# Patient Record
Sex: Female | Born: 1975 | Race: White | Hispanic: No | Marital: Single | Smoking: Current every day smoker
Health system: Southern US, Community
[De-identification: ages and names within clinical notes are randomized; demographics above are authoritative.]

## PROBLEM LIST (undated history)

## (undated) DIAGNOSIS — R000 Tachycardia, unspecified: Secondary | ICD-10-CM

---

## 2004-08-27 ENCOUNTER — Emergency Department: Payer: Self-pay | Admitting: Emergency Medicine

## 2005-03-20 ENCOUNTER — Emergency Department: Payer: Self-pay | Admitting: Emergency Medicine

## 2005-03-21 ENCOUNTER — Emergency Department (HOSPITAL_COMMUNITY): Admission: EM | Admit: 2005-03-21 | Discharge: 2005-03-21 | Payer: Self-pay | Admitting: Emergency Medicine

## 2006-01-14 ENCOUNTER — Emergency Department (HOSPITAL_COMMUNITY): Admission: EM | Admit: 2006-01-14 | Discharge: 2006-01-14 | Payer: Self-pay | Admitting: Emergency Medicine

## 2006-03-02 ENCOUNTER — Emergency Department (HOSPITAL_COMMUNITY): Admission: EM | Admit: 2006-03-02 | Discharge: 2006-03-02 | Payer: Self-pay | Admitting: Emergency Medicine

## 2006-03-17 ENCOUNTER — Emergency Department (HOSPITAL_COMMUNITY): Admission: EM | Admit: 2006-03-17 | Discharge: 2006-03-17 | Payer: Self-pay | Admitting: Emergency Medicine

## 2006-08-13 ENCOUNTER — Emergency Department: Payer: Self-pay | Admitting: Emergency Medicine

## 2006-09-07 ENCOUNTER — Emergency Department: Payer: Self-pay | Admitting: Emergency Medicine

## 2006-09-24 ENCOUNTER — Emergency Department (HOSPITAL_COMMUNITY): Admission: EM | Admit: 2006-09-24 | Discharge: 2006-09-24 | Payer: Self-pay | Admitting: Emergency Medicine

## 2006-12-23 ENCOUNTER — Emergency Department (HOSPITAL_COMMUNITY): Admission: EM | Admit: 2006-12-23 | Discharge: 2006-12-23 | Payer: Self-pay | Admitting: Emergency Medicine

## 2007-04-14 ENCOUNTER — Emergency Department: Payer: Self-pay | Admitting: Emergency Medicine

## 2007-05-01 ENCOUNTER — Emergency Department: Payer: Self-pay | Admitting: Emergency Medicine

## 2007-06-06 ENCOUNTER — Emergency Department: Payer: Self-pay | Admitting: Emergency Medicine

## 2007-07-06 ENCOUNTER — Emergency Department (HOSPITAL_COMMUNITY): Admission: EM | Admit: 2007-07-06 | Discharge: 2007-07-06 | Payer: Self-pay | Admitting: Emergency Medicine

## 2007-07-06 ENCOUNTER — Emergency Department: Payer: Self-pay | Admitting: Emergency Medicine

## 2007-07-12 ENCOUNTER — Emergency Department: Payer: Self-pay | Admitting: Emergency Medicine

## 2007-10-15 ENCOUNTER — Observation Stay: Payer: Self-pay | Admitting: Obstetrics and Gynecology

## 2007-10-28 ENCOUNTER — Observation Stay: Payer: Self-pay

## 2007-10-30 ENCOUNTER — Ambulatory Visit: Payer: Self-pay | Admitting: Unknown Physician Specialty

## 2007-12-24 ENCOUNTER — Observation Stay: Payer: Self-pay

## 2008-01-08 ENCOUNTER — Observation Stay: Payer: Self-pay

## 2008-01-21 ENCOUNTER — Inpatient Hospital Stay: Payer: Self-pay

## 2008-10-31 ENCOUNTER — Ambulatory Visit: Payer: Self-pay | Admitting: Unknown Physician Specialty

## 2009-12-26 ENCOUNTER — Emergency Department: Payer: Self-pay | Admitting: Emergency Medicine

## 2013-10-27 ENCOUNTER — Ambulatory Visit: Payer: Self-pay | Admitting: Family Medicine

## 2014-08-20 ENCOUNTER — Emergency Department: Payer: Self-pay | Admitting: Emergency Medicine

## 2014-08-20 LAB — BASIC METABOLIC PANEL
ANION GAP: 9 (ref 7–16)
BUN: 5 mg/dL — AB (ref 7–18)
CALCIUM: 8.6 mg/dL (ref 8.5–10.1)
CHLORIDE: 108 mmol/L — AB (ref 98–107)
CO2: 24 mmol/L (ref 21–32)
Creatinine: 0.76 mg/dL (ref 0.60–1.30)
EGFR (African American): >60
EGFR (Non-African Amer.): >60
GLUCOSE: 88 mg/dL (ref 65–99)
Osmolality: 278 (ref 275–301)
Potassium: 3.6 mmol/L (ref 3.5–5.1)
Sodium: 141 mmol/L (ref 136–145)

## 2014-08-20 LAB — CBC
HCT: 39.8 % (ref 35.0–47.0)
HGB: 13.1 g/dL (ref 12.0–16.0)
MCH: 28 pg (ref 26.0–34.0)
MCHC: 32.8 g/dL (ref 32.0–36.0)
MCV: 86 fL (ref 80–100)
Platelet: 301 10*3/uL (ref 150–440)
RBC: 4.66 10*6/uL (ref 3.80–5.20)
RDW: 13.5 % (ref 11.5–14.5)
WBC: 8.5 10*3/uL (ref 3.6–11.0)

## 2015-07-21 ENCOUNTER — Encounter: Payer: Self-pay | Admitting: Emergency Medicine

## 2015-07-21 ENCOUNTER — Emergency Department
Admission: EM | Admit: 2015-07-21 | Discharge: 2015-07-21 | Disposition: A | Discharge status: Home / Self Care | Payer: Managed Care, Other (non HMO) | Attending: Emergency Medicine | Admitting: Emergency Medicine

## 2015-07-21 DIAGNOSIS — Z880 Allergy status to penicillin: Secondary | ICD-10-CM | POA: Insufficient documentation

## 2015-07-21 DIAGNOSIS — R0981 Nasal congestion: Secondary | ICD-10-CM | POA: Diagnosis present

## 2015-07-21 DIAGNOSIS — F17210 Nicotine dependence, cigarettes, uncomplicated: Secondary | ICD-10-CM | POA: Diagnosis not present

## 2015-07-21 DIAGNOSIS — J0100 Acute maxillary sinusitis, unspecified: Secondary | ICD-10-CM | POA: Diagnosis not present

## 2015-07-21 MED ORDER — DOXYCYCLINE HYCLATE 50 MG PO CAPS: 100.0000 mg | ORAL_CAPSULE | Freq: Two times a day (BID) | ORAL | Status: DC

## 2015-07-21 NOTE — ED Provider Notes (Signed)
[facility] Emergency Department Provider Note  ____________________________________________  Time seen: On arrival  I have reviewed the triage vital signs and the nursing notes.   HISTORY  Chief Complaint Nasal Congestion    HPI Tamara Bridges is a 39 y.o. female who presents with complaints of sinus congestion and pain for one day. She also complains of mild hoarseness of her voice. She feels she has a sinus infection which is running down her throat and causing her to cough. She denies fevers chills. She has no other complaints    History reviewed. No pertinent past medical history.  There are no active problems to display for this patient.   History reviewed. No pertinent past surgical history.  Current Outpatient Rx  Name  Route  Sig  Dispense  Refill  . doxycycline (VIBRAMYCIN) 50 MG capsule   Oral   Take 2 capsules (100 mg total) by mouth 2 (two) times daily.   28 capsule   0     Allergies Azithromycin; Penicillins; and Prednisone  History reviewed. No pertinent family history.  Social History Social History  Substance Use Topics  . Smoking status: Current Every Day Smoker -- 1.00 packs/day    Types: Cigarettes  . Smokeless tobacco: None  . Alcohol Use: No    Review of Systems  Constitutional: Negative for fever. Eyes: Negative for visual changes. ENT: Negative for sore throat   Genitourinary: Negative for dysuria. Musculoskeletal: Negative for back pain. Skin: Negative for rash. Neurological: Negative for headaches or focal weakness   ____________________________________________   PHYSICAL EXAM:  VITAL SIGNS: ED Triage Vitals  Enc Vitals Group     BP 07/21/15 2226 125/60 mmHg     Pulse Rate 07/21/15 2226 81     Resp 07/21/15 2226 18     Temp 07/21/15 2226 97.9 F (36.6 C)     Temp Source 07/21/15 2226 Oral     SpO2 07/21/15 2226 98 %     Weight 07/21/15 2226 250 lb (113.399 kg)     Height 07/21/15 2226   (1.676 m)     Head Cir --      Peak Flow --      Pain Score 07/21/15 2227 6     Pain Loc --      Pain Edu? --      Excl. in GC? --      Constitutional: Alert and oriented. Well appearing and in no distress. Eyes: Conjunctivae are normal.  ENT   Head: Normocephalic and atraumatic. Mild tenderness to palpation over the maxillary sinuses bilaterally   Mouth/Throat: Mucous membranes are moist. Pharynx is normal Cardiovascular: Normal rate, regular rhythm.  Respiratory: Normal respiratory effort without tachypnea nor retractions.  Gastrointestinal: Soft and non-tender in all quadrants. No distention. There is no CVA tenderness. Musculoskeletal: Nontender with normal range of motion in all extremities.  Skin:  Skin is warm, dry and intact. No rash noted. Psychiatric: Mood and affect are normal. Patient exhibits appropriate insight and judgment.  ____________________________________________    LABS (pertinent positives/negatives)  Labs Reviewed - No data to display  ____________________________________________     ____________________________________________    RADIOLOGY I have personally reviewed any xrays that were ordered on this patient: None  ____________________________________________   PROCEDURES  Procedure(s) performed: none   ____________________________________________   INITIAL IMPRESSION / ASSESSMENT AND PLAN / ED COURSE  Pertinent labs & imaging results that were available during my care of the patient were reviewed by me and  considered in my medical decision making (see chart for details).  History of present illness consistent with sinus infection, given her allergies we will discharge her with doxycycline Rx which is worked for her in the past. Return precautions discussed  ____________________________________________   FINAL CLINICAL IMPRESSION(S) / ED DIAGNOSES  Final diagnoses:  Acute maxillary sinusitis, recurrence not  specified     Jene Everyobert Ivry Pigue, MD 07/21/15 [id number]

## 2015-07-21 NOTE — ED Notes (Signed)
Patient with no complaints at this time. Respirations even and unlabored. Skin warm/dry. Discharge instructions reviewed with patient at this time. Patient given opportunity to voice concerns/ask questions. Patient discharged at this time and left Emergency Department with steady gait.   

## 2015-07-21 NOTE — Discharge Instructions (Signed)

## 2015-07-21 NOTE — ED Notes (Signed)
Pt reports nasal congestion and head congestion.  Hearing acuity is unaffected.  She presents with voice hoarseness in what she feels is laryngitis and she seeks treatment for relief of symptoms.  She reports no dizziness, difficulty breathing, or general weakness.

## 2015-11-27 ENCOUNTER — Ambulatory Visit (INDEPENDENT_AMBULATORY_CARE_PROVIDER_SITE_OTHER): Payer: Managed Care, Other (non HMO)

## 2015-11-27 ENCOUNTER — Ambulatory Visit (INDEPENDENT_AMBULATORY_CARE_PROVIDER_SITE_OTHER): Payer: Managed Care, Other (non HMO) | Admitting: Podiatry

## 2015-11-27 DIAGNOSIS — M722 Plantar fascial fibromatosis: Secondary | ICD-10-CM | POA: Diagnosis not present

## 2015-11-27 DIAGNOSIS — Q828 Other specified congenital malformations of skin: Secondary | ICD-10-CM

## 2015-11-27 MED ORDER — DICLOFENAC SODIUM 75 MG PO TBEC: 75.0000 mg | DELAYED_RELEASE_TABLET | Freq: Two times a day (BID) | ORAL | Status: DC

## 2015-11-27 NOTE — Progress Notes (Signed)
   Subjective:    Patient ID: Tamara DickinsonKristina E Embry, female    DOB: 10/28/1975, 40 y.o.   MRN: [id number]  HPI: She presents today with a chief complaint of bilateral heel pain left greater than right after several years of having no pain. She's also complaining of a painful lesion sub-fifth metatarsal head of the left foot which she thinks contains a foreign body. She continues to take diclofenac on a regular basis and she wears [employer] shoes.    Review of Systems  HENT: Positive for sinus pressure.   Musculoskeletal: Positive for back pain.  All other systems reviewed and are negative.      Objective:   Physical Exam:: Vital signs are stable alert and oriented 3. Pulses are palpable. Neurologic sensorium is intact. Muscle strength is intact. Deep tendon reflexes are intact. Orthopedic evaluation of a straight awl just distal to the ankle for range of motion without crepitation that she does have moderate to severe pain on palpation medial calcaneal tubercle bilateral. Cutaneous evaluation does demonstrate a Salter porokeratotic lesion plantar aspect sub-fifth metatarsal head of the left foot. Radiographs taken today do not demonstrate any type of osseus abnormalities other than soft tissue increase in density at the plantar fascial calcaneal insertion site and no foreign body noted in the left foot.      Assessment & Plan:  Assessment: Plantar fasciitis bilateral. Left greater than right. Or keratoma fifth metatarsal base left foot.  Plan: I injected the fifth metatarsal area today under the lesion to alleviate the symptoms. I was better able to enucleate the lesion today. I also injected the bilateral heels and placed her plantar fascial braces bilaterally. I will follow-up with her in 1 month. She will continue her diclofenac. We did discuss appropriate shoe gear particularly wider shoes.

## 2015-11-27 NOTE — Patient Instructions (Signed)

## 2015-12-27 ENCOUNTER — Ambulatory Visit: Payer: Managed Care, Other (non HMO) | Admitting: Podiatry

## 2016-07-26 ENCOUNTER — Other Ambulatory Visit: Payer: Self-pay | Admitting: Podiatry

## 2016-07-26 NOTE — Telephone Encounter (Signed)
Pt needs an appt prior to future refills. 

## 2016-09-17 ENCOUNTER — Emergency Department: Payer: Managed Care, Other (non HMO)

## 2016-09-17 ENCOUNTER — Emergency Department
Admission: EM | Admit: 2016-09-17 | Discharge: 2016-09-17 | Disposition: A | Discharge status: Home / Self Care | Payer: Managed Care, Other (non HMO) | Attending: Emergency Medicine | Admitting: Emergency Medicine

## 2016-09-17 ENCOUNTER — Encounter: Payer: Self-pay | Admitting: Emergency Medicine

## 2016-09-17 DIAGNOSIS — F17210 Nicotine dependence, cigarettes, uncomplicated: Secondary | ICD-10-CM | POA: Diagnosis not present

## 2016-09-17 DIAGNOSIS — Z79899 Other long term (current) drug therapy: Secondary | ICD-10-CM | POA: Diagnosis not present

## 2016-09-17 DIAGNOSIS — R109 Unspecified abdominal pain: Secondary | ICD-10-CM | POA: Diagnosis present

## 2016-09-17 DIAGNOSIS — N390 Urinary tract infection, site not specified: Secondary | ICD-10-CM | POA: Insufficient documentation

## 2016-09-17 LAB — URINALYSIS, COMPLETE (UACMP) WITH MICROSCOPIC
Bacteria, UA: NONE SEEN
Bilirubin Urine: NEGATIVE
Glucose, UA: NEGATIVE mg/dL
Ketones, ur: NEGATIVE mg/dL
Nitrite: NEGATIVE
PH: 5 (ref 5.0–8.0)
Protein, ur: 100 mg/dL — AB
SPECIFIC GRAVITY, URINE: 1.019 (ref 1.005–1.030)

## 2016-09-17 LAB — BASIC METABOLIC PANEL
ANION GAP: 10 (ref 5–15)
BUN: 10 mg/dL (ref 6–20)
CHLORIDE: 103 mmol/L (ref 101–111)
CO2: 23 mmol/L (ref 22–32)
Calcium: 8.8 mg/dL — ABNORMAL LOW (ref 8.9–10.3)
Creatinine, Ser: 0.67 mg/dL (ref 0.44–1.00)
GFR calc non Af Amer: >60 mL/min (ref >60)
Glucose, Bld: 137 mg/dL — ABNORMAL HIGH (ref 65–99)
POTASSIUM: 3.9 mmol/L (ref 3.5–5.1)
Sodium: 136 mmol/L (ref 135–145)

## 2016-09-17 LAB — CBC
HCT: 40.3 % (ref 35.0–47.0)
HEMOGLOBIN: 13.9 g/dL (ref 12.0–16.0)
MCH: 27.8 pg (ref 26.0–34.0)
MCHC: 34.4 g/dL (ref 32.0–36.0)
MCV: 80.8 fL (ref 80.0–100.0)
Platelets: 377 10*3/uL (ref 150–440)
RBC: 4.99 MIL/uL (ref 3.80–5.20)
RDW: 14.7 % — ABNORMAL HIGH (ref 11.5–14.5)
WBC: 21.1 10*3/uL — AB (ref 3.6–11.0)

## 2016-09-17 LAB — POCT PREGNANCY, URINE: PREG TEST UR: NEGATIVE

## 2016-09-17 MED ORDER — CEPHALEXIN 500 MG PO CAPS: 500.0000 mg | ORAL_CAPSULE | Freq: Two times a day (BID) | ORAL | 0 refills | Status: DC

## 2016-09-17 MED ORDER — DEXTROSE 5 % IV SOLN: 1.0000 g | Freq: Once | INTRAVENOUS | Status: DC

## 2016-09-17 MED ORDER — CEFTRIAXONE SODIUM-DEXTROSE 1-3.74 GM-% IV SOLR
1.0000 g | Freq: Once | INTRAVENOUS | Status: AC
  Administered 2016-09-17: 1 g via INTRAVENOUS
  Filled 2016-09-17: qty 50

## 2016-09-17 NOTE — ED Triage Notes (Signed)
Patient ambulatory to triage with steady gait, without difficulty or distress noted; pt reports hematuria, dysuria, right flank pain radiating to right lower abd since yesterday

## 2016-09-17 NOTE — ED Notes (Signed)
Patient transported to CT 

## 2016-09-17 NOTE — ED Provider Notes (Signed)
[facility] Emergency Department Provider Note   ____________________________________________    I have reviewed the triage vital signs and the nursing notes.   HISTORY  Chief Complaint Dysuria    HPI Tamara Bridges is a 41 y.o. female who presents with complaints of urinary hesitancy and discomfort which started yesterday evening. She reports a history of chronic back pain and reports her back pain is noted different than usual. She denies fevers or chills. No nausea or vomiting. Otherwise she feels quite well. No vaginal discharge.   No past medical history on file.  There are no active problems to display for this patient.   No past surgical history on file.  Prior to Admission medications   Medication Sig Start Date End Date Taking? Authorizing Provider  Butalbital-APAP-Caffeine 50-300-40 MG CAPS  11/21/15   Historical Provider, MD  cephALEXin (KEFLEX) 500 MG capsule Take 1 capsule (500 mg total) by mouth 2 (two) times daily. 09/17/16   Jene Everyobert Bawi Lakins, MD  cyclobenzaprine (FLEXERIL) 10 MG tablet  11/21/15   Historical Provider, MD  diclofenac (VOLTAREN) 75 MG EC tablet  11/20/15   Historical Provider, MD  diclofenac (VOLTAREN) 75 MG EC tablet TAKE ONE TABLET BY MOUTH TWICE DAILY 07/26/16   Max T Hyatt, DPM  levocetirizine (XYZAL) 5 MG tablet  11/20/15   Historical Provider, MD  phentermine (ADIPEX-P) 37.5 MG tablet  11/20/15   Historical Provider, MD     Allergies Amoxicillin; Azithromycin; Penicillins; and Prednisone  No family history on file.  Social History Social History  Substance Use Topics  . Smoking status: Current Every Day Smoker    Packs/day: 1.00    Types: Cigarettes  . Smokeless tobacco: Not on file  . Alcohol use No    Review of Systems  Constitutional: No fever/chills Eyes: No visual changes.    Respiratory: Denies shortness of breath. Gastrointestinal: As above Genitourinary: As above Musculoskeletal: Chronic  back pain Skin: Negative for rash. Neurological: Negative for headaches   10-point ROS otherwise negative.  ____________________________________________   PHYSICAL EXAM:  VITAL SIGNS: ED Triage Vitals  Enc Vitals Group     BP 09/17/16 0702 122/84     Pulse Rate 09/17/16 0702 87     Resp 09/17/16 0702 16     Temp 09/17/16 0702 97.8 F (36.6 C)     Temp Source 09/17/16 0702 Oral     SpO2 09/17/16 0702 98 %     Weight 09/17/16 0653 220 lb (99.8 kg)     Height 09/17/16 0653 5\' 6"  (1.676 m)     Head Circumference --      Peak Flow --      Pain Score 09/17/16 0653 10     Pain Loc --      Pain Edu? --      Excl. in GC? --     Constitutional: Alert and oriented. No acute distress. Pleasant and interactive   Nose: No congestion/rhinnorhea.  Cardiovascular: Normal rate, regular rhythm.  Good peripheral circulation. Respiratory: Normal respiratory effort.  No retractions.  Gastrointestinal: Soft and nontender. No distention.  No CVA tenderness. Genitourinary: deferred Musculoskeletal: Warm and well perfused Neurologic:  Normal speech and language. No gross focal neurologic deficits are appreciated.  Skin:  Skin is warm, dry and intact. No rash noted. Psychiatric: Mood and affect are normal. Speech and behavior are normal.  ____________________________________________   LABS (all labs ordered are listed, but only abnormal results are displayed)  Labs Reviewed  URINALYSIS, COMPLETE (UACMP) WITH MICROSCOPIC - Abnormal; Notable for the following:       Result Value   Color, Urine YELLOW (*)    APPearance CLOUDY (*)    Hgb urine dipstick LARGE (*)    Protein, ur 100 (*)    Leukocytes, UA LARGE (*)    Squamous Epithelial / LPF 0-5 (*)    All other components within normal limits  BASIC METABOLIC PANEL - Abnormal; Notable for the following:    Glucose, Bld 137 (*)    Calcium 8.8 (*)    All other components within normal limits  CBC - Abnormal; Notable for the following:     WBC 21.1 (*)    RDW 14.7 (*)    All other components within normal limits  URINE CULTURE  POC URINE PREG, ED  POCT PREGNANCY, URINE   ____________________________________________  EKG  None ____________________________________________  RADIOLOGY  CT renal stone study unremarkable ____________________________________________   PROCEDURES  Procedure(s) performed: No    Critical Care performed: No ____________________________________________   INITIAL IMPRESSION / ASSESSMENT AND PLAN / ED COURSE  Pertinent labs & imaging results that were available during my care of the patient were reviewed by me and considered in my medical decision making (see chart for details).  Patient well-appearing and in no acute distress. She is nontoxic, her vital signs are normal. Urinalysis is consistent with UTI, she has no CVA tenderness. She   has a significantly elevated white blood cell count but again is very well-appearing. Renal stone study is negative. Treated with IV Rocephin and will go home on Keflex for UTI. Return precautions discussed ____________________________________________   FINAL CLINICAL IMPRESSION(S) / ED DIAGNOSES  Final diagnoses:  Right flank pain  Lower urinary tract infectious disease      NEW MEDICATIONS STARTED DURING THIS VISIT:  New Prescriptions   CEPHALEXIN (KEFLEX) 500 MG CAPSULE    Take 1 capsule (500 mg total) by mouth 2 (two) times daily.     Note:  This document was prepared using Dragon voice recognition software and may include unintentional dictation errors.    Jene Every, MD 09/17/16 [phone]

## 2016-09-19 LAB — URINE CULTURE: Culture: >=100000 — AB

## 2016-09-20 ENCOUNTER — Telehealth: Payer: Self-pay | Admitting: Pharmacist

## 2016-09-20 NOTE — Telephone Encounter (Signed)
Pt called and informed of urine cx results. Urine growing ESBL- not covered by d/c abx. [facility] [address] Pt concerned about cost of abx. 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 2018 Formulary searched, appears to be on tier 1 generic- should be pt lowest copay.  macrobid 100mg  BID x7 days, # 14 NR called into pharmacy. Left message on pharmacy voicemail. Authorized by Dr. Willy EddyPatrick Robinson  Olene FlossMelissa D Maccia, Pharm.D, BCPS Clinical Pharmacist

## 2019-02-16 ENCOUNTER — Other Ambulatory Visit: Payer: Self-pay | Admitting: Internal Medicine

## 2019-02-21 LAB — NOVEL CORONAVIRUS, NAA: SARS-CoV-2, NAA: NOT DETECTED

## 2019-06-14 DIAGNOSIS — Z20828 Contact with and (suspected) exposure to other viral communicable diseases: Secondary | ICD-10-CM | POA: Diagnosis not present

## 2019-10-28 DIAGNOSIS — Z111 Encounter for screening for respiratory tuberculosis: Secondary | ICD-10-CM | POA: Diagnosis not present

## 2020-05-04 DIAGNOSIS — Z0000 Encounter for general adult medical examination without abnormal findings: Secondary | ICD-10-CM | POA: Diagnosis not present

## 2020-05-04 DIAGNOSIS — R7309 Other abnormal glucose: Secondary | ICD-10-CM | POA: Diagnosis not present

## 2020-05-04 DIAGNOSIS — E669 Obesity, unspecified: Secondary | ICD-10-CM | POA: Diagnosis not present

## 2020-05-04 DIAGNOSIS — E785 Hyperlipidemia, unspecified: Secondary | ICD-10-CM | POA: Diagnosis not present

## 2020-07-13 ENCOUNTER — Other Ambulatory Visit: Payer: Self-pay | Admitting: Family Medicine

## 2020-07-13 DIAGNOSIS — R69 Illness, unspecified: Secondary | ICD-10-CM | POA: Diagnosis not present

## 2020-07-13 DIAGNOSIS — Z124 Encounter for screening for malignant neoplasm of cervix: Secondary | ICD-10-CM | POA: Diagnosis not present

## 2020-07-13 DIAGNOSIS — Z1231 Encounter for screening mammogram for malignant neoplasm of breast: Secondary | ICD-10-CM

## 2020-08-22 DIAGNOSIS — R69 Illness, unspecified: Secondary | ICD-10-CM | POA: Diagnosis not present

## 2021-05-04 ENCOUNTER — Other Ambulatory Visit: Payer: Self-pay

## 2021-05-04 ENCOUNTER — Emergency Department: Payer: Managed Care, Other (non HMO)

## 2021-05-04 ENCOUNTER — Emergency Department
Admission: EM | Admit: 2021-05-04 | Discharge: 2021-05-04 | Disposition: A | Discharge status: Home / Self Care | Payer: Managed Care, Other (non HMO) | Attending: Emergency Medicine | Admitting: Emergency Medicine

## 2021-05-04 DIAGNOSIS — I471 Supraventricular tachycardia: Secondary | ICD-10-CM | POA: Insufficient documentation

## 2021-05-04 DIAGNOSIS — R002 Palpitations: Secondary | ICD-10-CM | POA: Diagnosis present

## 2021-05-04 DIAGNOSIS — F17210 Nicotine dependence, cigarettes, uncomplicated: Secondary | ICD-10-CM | POA: Diagnosis not present

## 2021-05-04 LAB — POC URINE PREG, ED: Preg Test, Ur: NEGATIVE

## 2021-05-04 LAB — BASIC METABOLIC PANEL
Anion gap: 7 (ref 5–15)
BUN: 10 mg/dL (ref 6–20)
CO2: 25 mmol/L (ref 22–32)
Calcium: 8.8 mg/dL — ABNORMAL LOW (ref 8.9–10.3)
Chloride: 106 mmol/L (ref 98–111)
Creatinine, Ser: 0.76 mg/dL (ref 0.44–1.00)
GFR, Estimated: >60 mL/min (ref >60)
Glucose, Bld: 121 mg/dL — ABNORMAL HIGH (ref 70–99)
Potassium: 4 mmol/L (ref 3.5–5.1)
Sodium: 138 mmol/L (ref 135–145)

## 2021-05-04 LAB — CBC
HCT: 43.3 % (ref 36.0–46.0)
Hemoglobin: 14.9 g/dL (ref 12.0–15.0)
MCH: 30 pg (ref 26.0–34.0)
MCHC: 34.4 g/dL (ref 30.0–36.0)
MCV: 87.1 fL (ref 80.0–100.0)
Platelets: 341 10*3/uL (ref 150–400)
RBC: 4.97 MIL/uL (ref 3.87–5.11)
RDW: 13.2 % (ref 11.5–15.5)
WBC: 13.4 10*3/uL — ABNORMAL HIGH (ref 4.0–10.5)
nRBC: 0 % (ref 0.0–0.2)

## 2021-05-04 LAB — TSH: TSH: 1.374 u[IU]/mL (ref 0.350–4.500)

## 2021-05-04 LAB — TROPONIN I (HIGH SENSITIVITY): Troponin I (High Sensitivity): 6 ng/L (ref <18)

## 2021-05-04 LAB — MAGNESIUM: Magnesium: 2.3 mg/dL (ref 1.7–2.4)

## 2021-05-04 MED ORDER — METOPROLOL TARTRATE 25 MG PO TABS
12.5000 mg | ORAL_TABLET | Freq: Once | ORAL | Status: AC
  Administered 2021-05-04: 12.5 mg via ORAL
  Filled 2021-05-04: qty 1

## 2021-05-04 MED ORDER — METOPROLOL TARTRATE 25 MG PO TABS: 12.5000 mg | ORAL_TABLET | Freq: Two times a day (BID) | ORAL | 0 refills | Status: DC

## 2021-05-04 NOTE — Discharge Instructions (Addendum)
Stay well-hydrated and get plenty of sleep.  Avoid caffeine, energy drinks, over-the-counter cough medicines, or vitamin/herbal supplements.  Call 911 and return to the ER if you have racing heart rate with chest pain, shortness of breath, or dizziness, or if it does not resolve with maneuvers and taking 1 extra tablet of 25mg  metoprolol.

## 2021-05-04 NOTE — ED Provider Notes (Signed)
[facility] Emergency Department Provider Note  ____________________________________________  Time seen: Approximately 7:10 PM  I have reviewed the triage vital signs and the nursing notes.   HISTORY  Chief Complaint Chest Pain    HPI Tamara Bridges is a 45 y.o. female with a past history of depression who comes ED complaining of palpitations and racing heart rate.  On her electronic watch she noted a heart rate of 200 during this episode.  This is the third episode.  Second episode was a week ago and the first episode was a month ago.  Previously the episodes resolved by going to lay down.  Denies chest pain shortness of breath dizziness or syncope.  No exertional symptoms.  She does report that she has been under a lot of stress lately. Denies drug abuse, alcohol abuse, over-the-counter stimulants such as cough medicines.  She does drink Pepsi multiple times a day, and she is a smoker.  EMS directed her and vagal maneuvers during transport and her heart rate returned to normal and symptoms resolved.  Currently she feels normal.   History reviewed. No pertinent past medical history.   There are no problems to display for this patient.    History reviewed. No pertinent surgical history.   Prior to Admission medications   Medication Sig Start Date End Date Taking? Authorizing Provider  metoprolol tartrate (LOPRESSOR) 25 MG tablet Take 0.5 tablets (12.5 mg total) by mouth 2 (two) times daily. 05/04/21 07/03/21 Yes Sharman Cheek, MD  Butalbital-APAP-Caffeine 50-300-40 MG CAPS  11/21/15   [provider]  cephALEXin (KEFLEX) 500 MG capsule Take 1 capsule (500 mg total) by mouth 2 (two) times daily. 09/17/16   Jene Every, MD  cyclobenzaprine (FLEXERIL) 10 MG tablet  11/21/15   [provider]  diclofenac (VOLTAREN) 75 MG EC tablet  11/20/15   [provider]  diclofenac (VOLTAREN) 75 MG EC tablet TAKE ONE TABLET BY MOUTH  TWICE DAILY 07/26/16   Hyatt, Max T, DPM  levocetirizine (XYZAL) 5 MG tablet  11/20/15   [provider]  phentermine (ADIPEX-P) 37.5 MG tablet  11/20/15   [provider]     Allergies Amoxicillin, Azithromycin, Penicillins, and Prednisone   No family history on file.  Social History Social History   Tobacco Use   Smoking status: Every Day    Packs/day: 1.00    Types: Cigarettes  Substance Use Topics   Alcohol use: No   Drug use: No    Review of Systems  Constitutional:   No fever or chills.  ENT:   No sore throat. No rhinorrhea. Cardiovascular:   No chest pain or syncope.  Positive palpitations Respiratory:   No dyspnea or cough. Gastrointestinal:   Negative for abdominal pain, vomiting and diarrhea.  Musculoskeletal:   Negative for focal pain or swelling All other systems reviewed and are negative except as documented above in ROS and HPI.  ____________________________________________   PHYSICAL EXAM:  VITAL SIGNS: ED Triage Vitals  Enc Vitals Group     BP 05/04/21 1800 115/69     Pulse Rate 05/04/21 1800 71     Resp 05/04/21 1800 18     Temp 05/04/21 1800 98 F (36.7 C)     Temp src --      SpO2 05/04/21 1800 99 %     Weight --      Height --      Head Circumference --      Peak Flow --  Pain Score 05/04/21 1758 2     Pain Loc --      Pain Edu? --      Excl. in GC? --     Vital signs reviewed, nursing assessments reviewed.   Constitutional:   Alert and oriented. Non-toxic appearance. Eyes:   Conjunctivae are normal. EOMI. PERRL. ENT      Head:   Normocephalic and atraumatic.      Nose:   Wearing a mask.      Mouth/Throat:   Wearing a mask.      Neck:   No meningismus. Full ROM. Hematological/Lymphatic/Immunilogical:   No cervical lymphadenopathy. Cardiovascular:   RRR. Symmetric bilateral radial and DP pulses.  No murmurs. Cap refill less than 2 seconds. Respiratory:   Normal respiratory effort without  tachypnea/retractions. Breath sounds are clear and equal bilaterally. No wheezes/rales/rhonchi. Gastrointestinal:   Soft and nontender. Non distended. There is no CVA tenderness.  No rebound, rigidity, or guarding. Genitourinary:   deferred Musculoskeletal:   Normal range of motion in all extremities. No joint effusions.  No lower extremity tenderness.  No edema. Neurologic:   Normal speech and language.  Motor grossly intact. No acute focal neurologic deficits are appreciated.  Skin:    Skin is warm, dry and intact. No rash noted.  No petechiae, purpura, or bullae.  ____________________________________________    LABS (pertinent positives/negatives) (all labs ordered are listed, but only abnormal results are displayed) Labs Reviewed  BASIC METABOLIC PANEL - Abnormal; Notable for the following components:      Result Value   Glucose, Bld 121 (*)    Calcium 8.8 (*)    All other components within normal limits  CBC - Abnormal; Notable for the following components:   WBC 13.4 (*)    All other components within normal limits  MAGNESIUM  TSH  POC URINE PREG, ED  TROPONIN I (HIGH SENSITIVITY)   ____________________________________________   EKG  Interpreted by me  Date: 05/04/2021  Rate: 94  Rhythm: normal sinus rhythm  QRS Axis: normal  Intervals: normal  ST/T Wave abnormalities: normal  Conduction Disutrbances: none  Narrative Interpretation: unremarkable   EMS EKG reviewed which shows SVT with a rate of 200  ____________________________________________    RADIOLOGY  DG Chest 2 View  Result Date: 05/04/2021 CLINICAL DATA:  Chest pain. EXAM: CHEST - 2 VIEW COMPARISON:  Chest x-ray dated August 20, 2014. FINDINGS: The heart size and mediastinal contours are within normal limits. Chronic peribronchial thickening, likely smoking-related. No focal consolidation, pleural effusion, or pneumothorax. No acute osseous abnormality. IMPRESSION: No active cardiopulmonary  disease. Electronically Signed   By: Obie Dredge M.D.   On: 05/04/2021 19:00    ____________________________________________   PROCEDURES Procedures  ____________________________________________    CLINICAL IMPRESSION / ASSESSMENT AND PLAN / ED COURSE  Medications ordered in the ED: Medications  metoprolol tartrate (LOPRESSOR) tablet 12.5 mg (has no administration in time range)    Pertinent labs & imaging results that were available during my care of the patient were reviewed by me and considered in my medical decision making (see chart for details).  Tamara Bridges was evaluated in Emergency Department on 05/04/2021 for the symptoms described in the history of present illness. She was evaluated in the context of the global COVID-19 pandemic, which necessitated consideration that the patient might be at risk for infection with the SARS-CoV-2 virus that causes COVID-19. Institutional protocols and algorithms that pertain to the evaluation of patients at risk for COVID-19 are  in a state of rapid change based on information released by regulatory bodies including the CDC and federal and state organizations. These policies and algorithms were followed during the patient's care in the ED.   Patient presents with an episode of SVT, currently back in normal sinus rhythm with a normal heart rate, normal blood pressure.  Asymptomatic.  Stable for discharge and outpatient follow-up with cardiology.  With blood pressure of about 100/60, I will start her on a low-dose metoprolol at 12.5 mg twice daily in the meantime.  Counseled her on hydration, plenty of rest, avoiding stimulants including caffeine.  Counseled on smoking cessation.  Labs are normal, no underlying metabolic cause.  Return precautions discussed.       ____________________________________________   FINAL CLINICAL IMPRESSION(S) / ED DIAGNOSES    Final diagnoses:  SVT (supraventricular tachycardia) [facility])     ED  Discharge Orders          Ordered    metoprolol tartrate (LOPRESSOR) 25 MG tablet  2 times daily        05/04/21 1906            Portions of this note were generated with dragon dictation software. Dictation errors may occur despite best attempts at proofreading.    Sharman Cheek, MD 05/04/21 [phone]

## 2021-05-04 NOTE — ED Triage Notes (Signed)
Pt in via EMS from work with c/o CP. EMS reports HR 207u pon their arrival. EMS had pt bare down and her heart converted to NSR. Pt reports same sx's 1 weeks ago but it went away and today she was not able to make to stop  128/70, 98% RA

## 2021-05-04 NOTE — ED Notes (Signed)
Patient to room from lobby via wheelchair, awake and alert no apparent distress.

## 2021-05-04 NOTE — ED Triage Notes (Signed)
Pt come with c/o CP and some pressure. Pt states she felt like her heart was racing. Pt states some SOb. Pt denies any N/V/D

## 2022-01-10 ENCOUNTER — Emergency Department: Payer: BC Managed Care – PPO

## 2022-01-10 ENCOUNTER — Other Ambulatory Visit: Payer: Self-pay

## 2022-01-10 ENCOUNTER — Emergency Department
Admission: EM | Admit: 2022-01-10 | Discharge: 2022-01-10 | Disposition: A | Discharge status: Home / Self Care | Payer: BC Managed Care – PPO | Attending: Student in an Organized Health Care Education/Training Program | Admitting: Student in an Organized Health Care Education/Training Program

## 2022-01-10 DIAGNOSIS — R0789 Other chest pain: Secondary | ICD-10-CM | POA: Diagnosis not present

## 2022-01-10 DIAGNOSIS — F17200 Nicotine dependence, unspecified, uncomplicated: Secondary | ICD-10-CM | POA: Diagnosis not present

## 2022-01-10 DIAGNOSIS — R0602 Shortness of breath: Secondary | ICD-10-CM | POA: Diagnosis not present

## 2022-01-10 DIAGNOSIS — R079 Chest pain, unspecified: Secondary | ICD-10-CM | POA: Diagnosis present

## 2022-01-10 LAB — BASIC METABOLIC PANEL
Anion gap: 11 (ref 5–15)
BUN: 11 mg/dL (ref 6–20)
CO2: 25 mmol/L (ref 22–32)
Calcium: 9.2 mg/dL (ref 8.9–10.3)
Chloride: 104 mmol/L (ref 98–111)
Creatinine, Ser: 0.7 mg/dL (ref 0.44–1.00)
GFR, Estimated: >60 mL/min (ref >60)
Glucose, Bld: 119 mg/dL — ABNORMAL HIGH (ref 70–99)
Potassium: 3.9 mmol/L (ref 3.5–5.1)
Sodium: 140 mmol/L (ref 135–145)

## 2022-01-10 LAB — CBC
HCT: 43.5 % (ref 36.0–46.0)
Hemoglobin: 13.9 g/dL (ref 12.0–15.0)
MCH: 27.9 pg (ref 26.0–34.0)
MCHC: 32 g/dL (ref 30.0–36.0)
MCV: 87.3 fL (ref 80.0–100.0)
Platelets: 370 10*3/uL (ref 150–400)
RBC: 4.98 MIL/uL (ref 3.87–5.11)
RDW: 13.2 % (ref 11.5–15.5)
WBC: 17.7 10*3/uL — ABNORMAL HIGH (ref 4.0–10.5)
nRBC: 0 % (ref 0.0–0.2)

## 2022-01-10 LAB — D-DIMER, QUANTITATIVE: D-Dimer, Quant: 0.53 ug/mL-FEU — ABNORMAL HIGH (ref 0.00–0.50)

## 2022-01-10 LAB — TROPONIN I (HIGH SENSITIVITY)
Troponin I (High Sensitivity): 4 ng/L (ref <18)
Troponin I (High Sensitivity): 4 ng/L (ref <18)

## 2022-01-10 MED ORDER — ALBUTEROL SULFATE HFA 108 (90 BASE) MCG/ACT IN AERS: 2.0000 | INHALATION_SPRAY | Freq: Four times a day (QID) | RESPIRATORY_TRACT | 2 refills | Status: DC | PRN

## 2022-01-10 MED ORDER — DOXYCYCLINE HYCLATE 100 MG PO TABS: 100.0000 mg | ORAL_TABLET | Freq: Two times a day (BID) | ORAL | 0 refills | Status: AC

## 2022-01-10 MED ORDER — IOHEXOL 350 MG/ML SOLN
75.0000 mL | Freq: Once | INTRAVENOUS | Status: AC | PRN
  Administered 2022-01-10: 75 mL via INTRAVENOUS

## 2022-01-10 MED ORDER — IPRATROPIUM-ALBUTEROL 0.5-2.5 (3) MG/3ML IN SOLN
3.0000 mL | Freq: Once | RESPIRATORY_TRACT | Status: AC
  Administered 2022-01-10: 3 mL via RESPIRATORY_TRACT
  Filled 2022-01-10: qty 3

## 2022-01-10 MED ORDER — OXYCODONE-ACETAMINOPHEN 5-325 MG PO TABS
1.0000 | ORAL_TABLET | Freq: Once | ORAL | Status: AC
  Administered 2022-01-10: 1 via ORAL
  Filled 2022-01-10: qty 1

## 2022-01-10 NOTE — ED Provider Notes (Signed)
[facility] Provider Note    Event Date/Time   First MD Initiated Contact with Patient 01/10/22 2045     (approximate)   History   Chest Pain   HPI  Tamara Bridges is a 46 y.o. female history of bronchitis and smoking presents to the ER for evaluation of shortness of breath or chest pain she noted particular while she was at the grocery center today.  Does not feel like she is having any significant fevers no abdominal pain no nausea or vomiting.  No pain ripping or tearing through to her back.  Does hurt when she takes deep inspiration.  Has never had pain quite like this before has had bronchitis and pneumonia in the past.  She is been unable to tolerate prednisone due to jitteriness.     Physical Exam   Triage Vital Signs: ED Triage Vitals  Enc Vitals Group     BP 01/10/22 1929 134/67     Pulse Rate 01/10/22 1929 89     Resp 01/10/22 1929 18     Temp 01/10/22 1929 98.7 F (37.1 C)     Temp src --      SpO2 01/10/22 1929 97 %     Weight 01/10/22 1931 275 lb (124.7 kg)     Height 01/10/22 1931 5\' 6"  (1.676 m)     Head Circumference --      Peak Flow --      Pain Score 01/10/22 1930 8     Pain Loc --      Pain Edu? --      Excl. in GC? --     Most recent vital signs: Vitals:   01/10/22 2100 01/10/22 2230  BP: 129/60   Pulse: 89 89  Resp: (!) 23   Temp:    SpO2: 100% 97%     Constitutional: Alert  Eyes: Conjunctivae are normal.  Head: Atraumatic. Nose: No congestion/rhinnorhea. Mouth/Throat: Mucous membranes are moist.   Neck: Painless ROM.  Cardiovascular:   Good peripheral circulation. No mg/r Respiratory: Normal respiratory effort.  Scattered occasional exp wheeze Gastrointestinal: Soft and nontender.  Musculoskeletal:  no deformity Neurologic:  MAE spontaneously. No gross focal neurologic deficits are appreciated.  Skin:  Skin is warm, dry and intact. No rash noted. Psychiatric: Mood and affect are normal. Speech and  behavior are normal.    ED Results / Procedures / Treatments   Labs (all labs ordered are listed, but only abnormal results are displayed) Labs Reviewed  BASIC METABOLIC PANEL - Abnormal; Notable for the following components:      Result Value   Glucose, Bld 119 (*)    All other components within normal limits  CBC - Abnormal; Notable for the following components:   WBC 17.7 (*)    All other components within normal limits  D-DIMER, QUANTITATIVE - Abnormal; Notable for the following components:   D-Dimer, Quant 0.53 (*)    All other components within normal limits  POC URINE PREG, ED  TROPONIN I (HIGH SENSITIVITY)  TROPONIN I (HIGH SENSITIVITY)     EKG  ED ECG REPORT I, Willy Eddy, the attending physician, personally viewed and interpreted this ECG.   Date: 01/10/2022  EKG Time: 19:26  Rate: 90  Rhythm: sinus  Axis: normal  Intervals:normal  ST&T Change: no stemi, no depression    RADIOLOGY Please see ED Course for my review and interpretation.  I personally reviewed all radiographic images ordered to evaluate for the above  acute complaints and reviewed radiology reports and findings.  These findings were personally discussed with the patient.  Please see medical record for radiology report.    PROCEDURES:  Critical Care performed:  Procedures   MEDICATIONS ORDERED IN ED: Medications  oxyCODONE-acetaminophen (PERCOCET/ROXICET) 5-325 MG per tablet 1 tablet (has no administration in time range)  ipratropium-albuterol (DUONEB) 0.5-2.5 (3) MG/3ML nebulizer solution 3 mL (3 mLs Nebulization Given 01/10/22 2112)  iohexol (OMNIPAQUE) 350 MG/ML injection 75 mL (75 mLs Intravenous Contrast Given 01/10/22 2211)  ipratropium-albuterol (DUONEB) 0.5-2.5 (3) MG/3ML nebulizer solution 3 mL (3 mLs Nebulization Given 01/10/22 2255)     IMPRESSION / MDM / ASSESSMENT AND PLAN / ED COURSE  I reviewed the triage vital signs and the nursing notes.                               Differential diagnosis includes, but is not limited to, ACS, pericarditis, esophagitis, boerhaaves, pe, dissection, pna, bronchitis, costochondritis  Patient presented to the ER for evaluation of symptoms as described above.  She is afebrile hemodynamically stable no respiratory distress but based on her description this presenting complaint could reflect a potentially life-threatening illness therefore the patient will be placed on continuous pulse oximetry and telemetry for monitoring.  Laboratory evaluation will be sent to evaluate for the above complaints.     Clinical Course as of 01/10/22 2322  Thu Jan 10, 2022  2240 CTA imaging on my interpretation does not show any evidence of PE. [PR]  2319 CT imaging reassuring.  Serial enzymes negative.  I do suspect this is mild bronchitis.  Patient has an intolerance to prednisone will be given prescription for antibiotic as well as albuterol.  Patient agreeable plan.  Patient does appear stable and appropriate for outpatient follow-up. [PR]    Clinical Course User Index [PR] Merlyn Lot, MD     FINAL CLINICAL IMPRESSION(S) / ED DIAGNOSES   Final diagnoses:  Atypical chest pain     Rx / DC Orders   ED Discharge Orders          Ordered    doxycycline (VIBRA-TABS) 100 MG tablet  2 times daily        01/10/22 2315    albuterol (VENTOLIN HFA) 108 (90 Base) MCG/ACT inhaler  Every 6 hours PRN        01/10/22 2315             Note:  This document was prepared using Dragon voice recognition software and may include unintentional dictation errors.    Merlyn Lot, MD 01/10/22 2322

## 2022-01-10 NOTE — ED Notes (Signed)
Pt refused to be on cardiac monitor and than demanded the IV be taken out of her arm.

## 2022-01-10 NOTE — ED Triage Notes (Addendum)
Pt presents to ER c/o chest tightness in middle of chest that sometimes radiates across chest and into neck.  Pt states pain started Tuesday this week.  Pt states pain has been worse today than the last few days.  Pt also states her BP was 177/117 at home with no hx of HTN.  Pt denies associated sx.  Denies hx of asthma or COPD.  Pt A&O x4 in NAD in triage at this time, and ambulatory to room.

## 2022-01-10 NOTE — ED Notes (Addendum)
POC urine pregnancy test NEGATIVE 

## 2022-06-27 IMAGING — CT CT ANGIO CHEST
2 of 6 series · 17 of 46 positions shown · IV contrast (APPLIED)
Comparison: Partial comparison to CT abdomen/pelvis dated
09/17/2016.

CLINICAL DATA: Chest tightness, evaluate for PE

EXAM:
CT ANGIOGRAPHY CHEST WITH CONTRAST
TECHNIQUE: Multidetector CT imaging of the chest was performed using the
standard protocol during bolus administration of intravenous
contrast. Multiplanar CT image reconstructions and MIPs were
obtained to evaluate the vascular anatomy.

[Series 6: thins · axial · 0.73mm/px · z∈[-1145,-900]mm · 14 of 384 slices shown]
[im 17/384  lung]
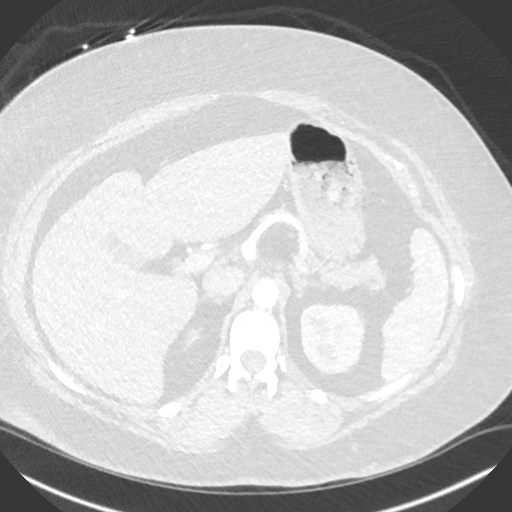
[im 50/384  soft-tissue]
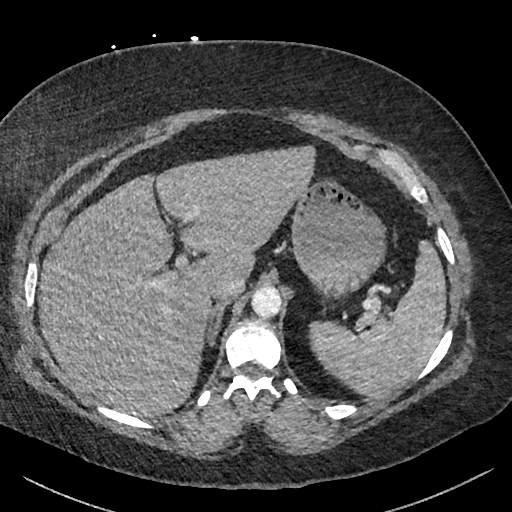
[im 67/384  lung]
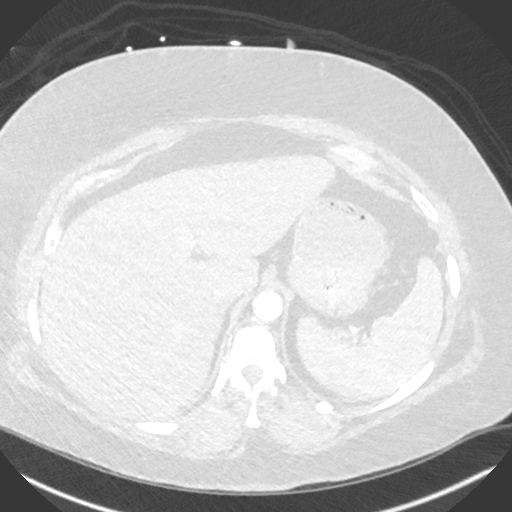
[im 100/384  soft-tissue]
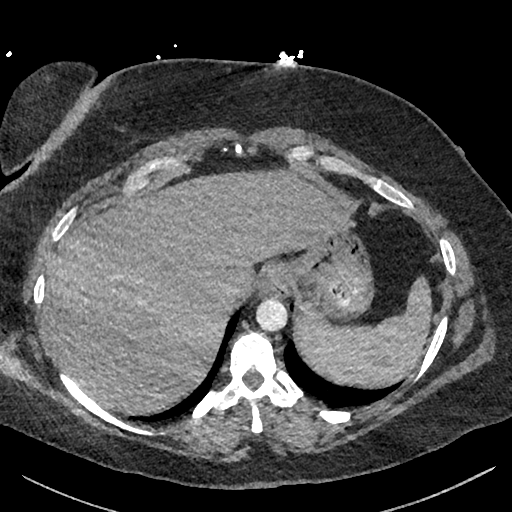
[im 134/384  lung]
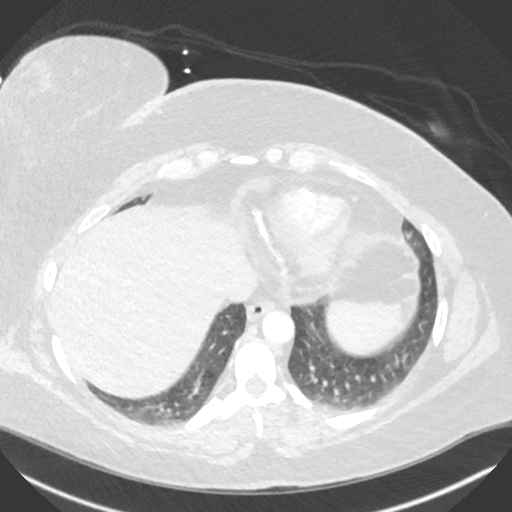
[im 150/384  soft-tissue]
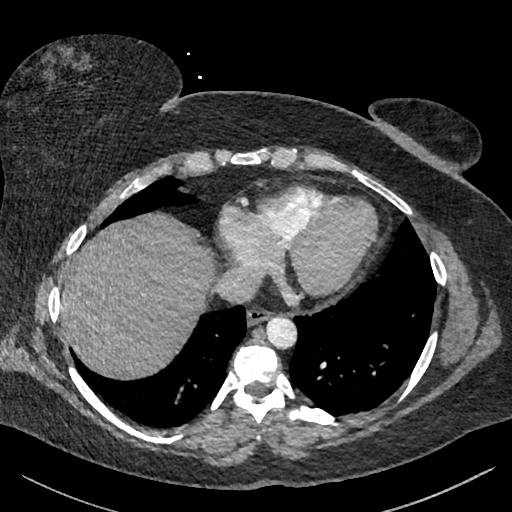
[im 184/384  lung]
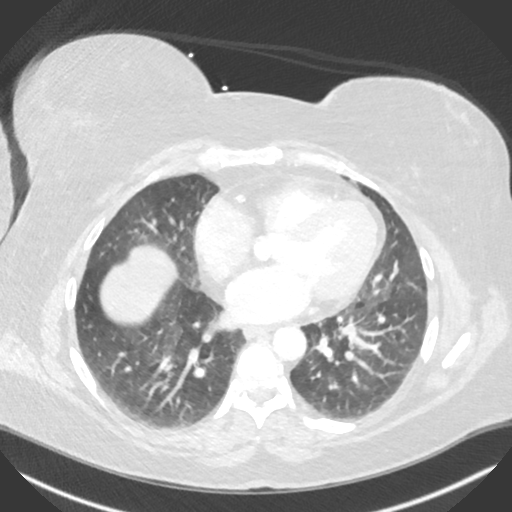
[im 200/384  soft-tissue]
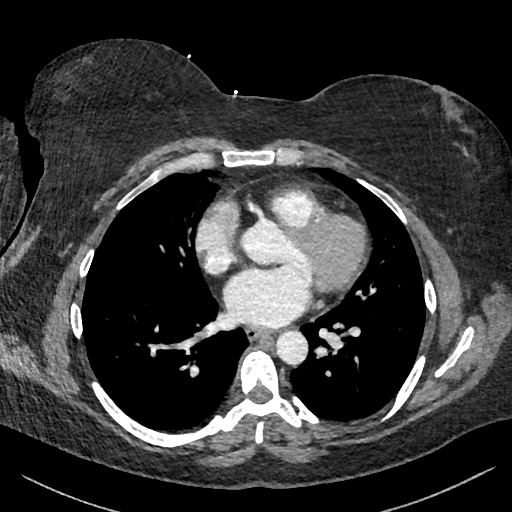
[im 234/384  lung]
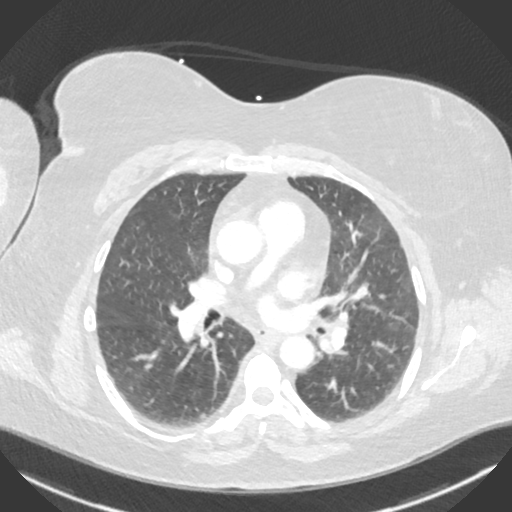
[im 250/384  soft-tissue]
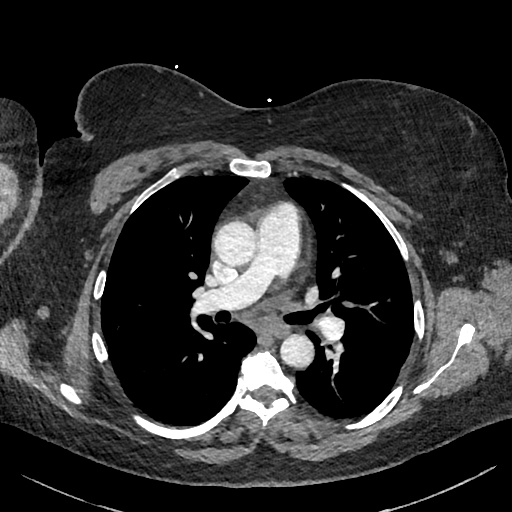
[im 284/384  lung]
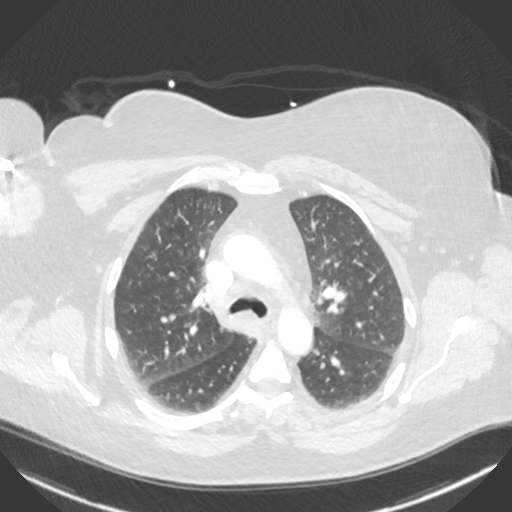
[im 317/384  soft-tissue]
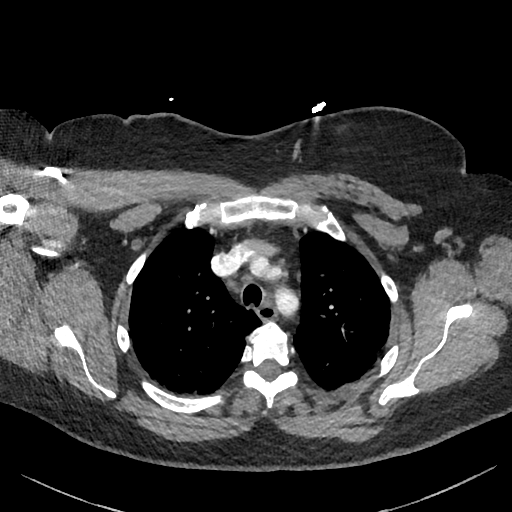
[im 334/384  lung]
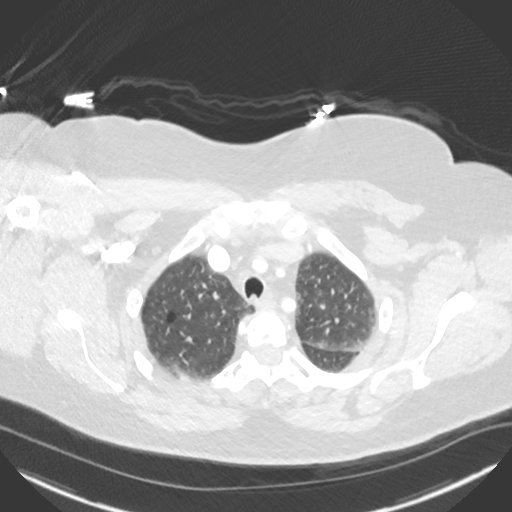
[im 367/384  soft-tissue]
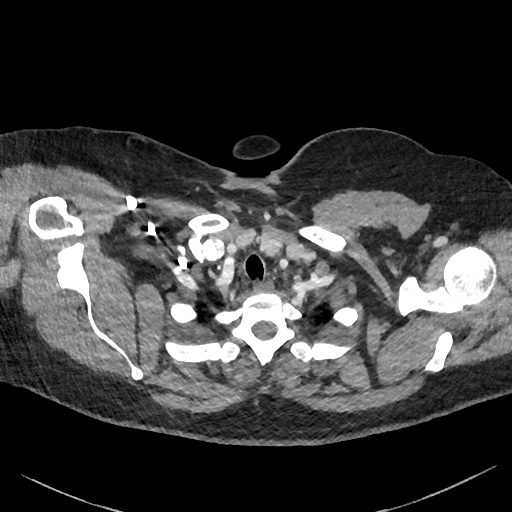

[Series 7: cor · coronal · 0.54mm/px · 3 of 149 slices shown]
[im 38/149  soft-tissue]
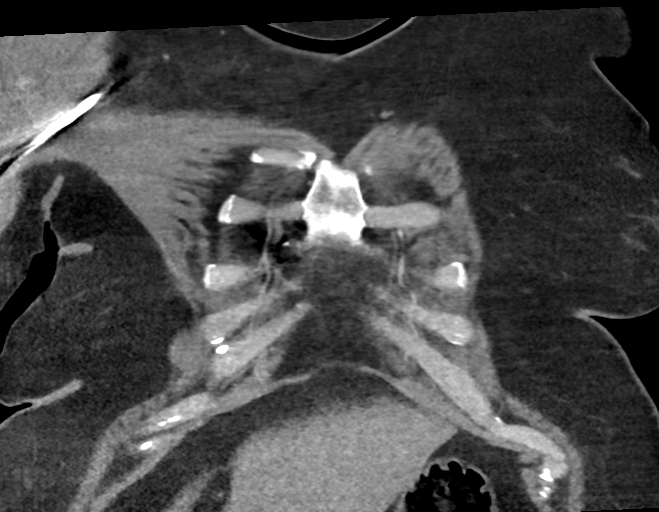
[im 75/149  soft-tissue]
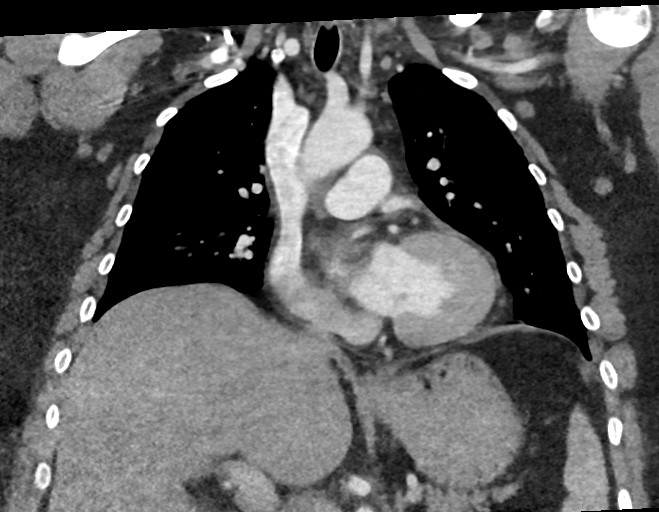
[im 112/149  soft-tissue]
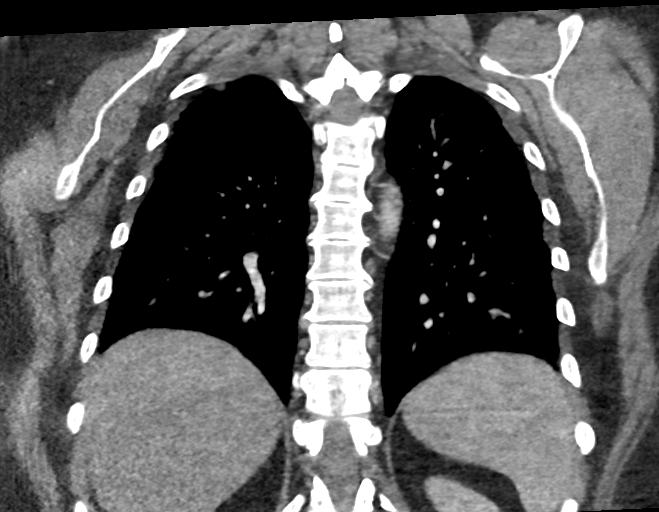

[17 of 46 positions shown; findings below may reference images not displayed]

RADIATION DOSE REDUCTION: This exam was performed according to the
departmental dose-optimization program which includes automated
exposure control, adjustment of the mA and/or kV according to
patient size and/or use of iterative reconstruction technique.

CONTRAST:  75mL OMNIPAQUE IOHEXOL 350 MG/ML SOLN
FINDINGS: Cardiovascular: Satisfactory opacification of the bilateral
pulmonary arteries to the lobar level. No evidence of pulmonary
embolism.

Although not tailored for evaluation of the thoracic aorta, there is
no evidence of thoracic aortic aneurysm or dissection.

The heart is normal in size.  No pericardial effusion.

Coronary atherosclerosis of the LAD and right coronary artery.

Mediastinum/Nodes: No suspicious mediastinal lymphadenopathy.

Visualized thyroid is unremarkable.

Lungs/Pleura: Mild mosaic attenuation in the lungs bilaterally,
suggesting air trapping.

No focal consolidation.

Mild centrilobular and paraseptal emphysematous changes, upper lung
predominant.

Scattered bilateral pulmonary nodules, including two dominant 5 mm
nodules in the left upper lobe (series 5/images 26 and 43) and a 5
mm nodule in the left lower lobe (series 5/image 82). The left lower
lobe nodule is likely unchanged from 9796, benign. While this is
reassuring, the other nodules were not previously imaged.

No pleural effusion or pneumothorax.

Upper Abdomen: Visualized upper abdomen is grossly unremarkable.

Musculoskeletal: Mild degenerative changes of the visualized
thoracolumbar spine.

Review of the MIP images confirms the above findings.
IMPRESSION: No evidence of pulmonary embolism.

Multiple pulmonary nodules measuring up to 5 mm in the left upper
lobe. Non-contrast chest CT can be considered in 12 months in this
high risk patient. This recommendation follows the consensus
statement: Guidelines for Management of Incidental Pulmonary Nodules
Detected on CT Images: From the [HOSPITAL] 9537; Radiology

Emphysema ([id number]).

## 2023-01-25 ENCOUNTER — Emergency Department
Admission: EM | Admit: 2023-01-25 | Discharge: 2023-01-25 | Disposition: A | Discharge status: Home / Self Care | Payer: BC Managed Care – PPO | Attending: Emergency Medicine | Admitting: Emergency Medicine

## 2023-01-25 ENCOUNTER — Other Ambulatory Visit: Payer: Self-pay

## 2023-01-25 DIAGNOSIS — M25561 Pain in right knee: Secondary | ICD-10-CM | POA: Diagnosis not present

## 2023-01-25 HISTORY — DX: Tachycardia, unspecified: R00.0

## 2023-01-25 NOTE — ED Provider Notes (Signed)
[facility] Provider Note    Event Date/Time   First MD Initiated Contact with Patient 01/25/23 1552     (approximate)   History   Knee Pain   HPI  Tamara Bridges is a 47 y.o. female with PMH of tachycardia who presents to the ED for evaluation of right knee pain.  She states that this morning her right leg felt different and thought her right knee was more swollen than her left knee.  Patient denies instability while walking, knee pain, falls and other trauma to the knee.  She took some ibuprofen this morning but has not tried anything else for pain.  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     Physical Exam   Triage Vital Signs: ED Triage Vitals  Enc Vitals Group     BP 01/25/23 1428 131/73     Pulse Rate 01/25/23 1428 94     Resp 01/25/23 1428 18     Temp 01/25/23 1428 97.8 F (36.6 C)     Temp Source 01/25/23 1428 Oral     SpO2 01/25/23 1428 94 %     Weight --      Height --      Head Circumference --      Peak Flow --      Pain Score 01/25/23 1433 0     Pain Loc --      Pain Edu? --      Excl. in GC? --     Most recent vital signs: Vitals:   01/25/23 1428  BP: 131/73  Pulse: 94  Resp: 18  Temp: 97.8 F (36.6 C)  SpO2: 94%   General: Awake, no distress.  CV:  Good peripheral perfusion.  Resp:  Normal effort.  Abd:  No distention.  R Knee: No appreciable swelling, no bruises or overlying skin changes, mild TTP over lateral joint line of the knee, full ROM in knee flexion and extension, 5/5 strength, negative posterior and anterior drawer test, negative McMurray's, negative patellar grind test, no ligament laxity with varus/valgus stress.   ED Results / Procedures / Treatments   Labs (all labs ordered are listed, but only abnormal results are displayed) Labs Reviewed - No data to display    PROCEDURES:  Critical Care performed: No  Procedures   MEDICATIONS ORDERED IN ED: Medications  - No data to display   IMPRESSION / MDM / ASSESSMENT AND PLAN / ED COURSE  I reviewed the triage vital signs and the nursing notes.                              Differential diagnosis includes, but is not limited to, joint effusion, DVT, ligament injury, muscle strain, contusion.  Patient's presentation is most consistent with acute, uncomplicated illness.  Patient presented for evaluation of her right knee.  She states that it felt different this morning but is unable to further describe her symptoms.  Her knee exam was unremarkable.  I do not suspect a DVT as she does not have any unilateral leg swelling, has not had any recent surgeries, no long trips, no SOB, no CP. Her Wells score for DVT was 0.  At this point I think the most likely diagnosis is a muscle strain.  Patient states that she normally takes diclofenac for her plantar fasciitis but has not taken this recently.  I explained that  I would treat this with an NSAID and that she can take the diclofenac to treat this as well.  She explained that she has a prescription available for pickup at [facility], so I am not going to send one in for her.  We discussed the possibility of getting a right knee x-ray, but I do not feel it is necessary at this point given she has not had any specific injury and does not have appreciable swelling.  Patient was agreeable to this.  I will provide her with contact information for orthopedics and explained she can follow-up with them if she continues to have symptoms.  Patient voiced understanding, all questions were answered, patient stable for discharge.      FINAL CLINICAL IMPRESSION(S) / ED DIAGNOSES   Final diagnoses:  Acute pain of right knee     Rx / DC Orders   ED Discharge Orders     None        Note:  This document was prepared using Dragon voice recognition software and may include unintentional dictation errors.   Cameron Ali, PA-C 01/25/23 1629    Concha Se,  MD 01/27/23 [phone]

## 2023-01-25 NOTE — ED Triage Notes (Signed)
Pt presents via POV c/o right leg "feeling different" denies pain. Reports increased swelling. Leg does not appear significantly swollen on assessment. Ambulatory to triage. Reports had right leg cramp this am but has resolved.

## 2023-05-04 ENCOUNTER — Ambulatory Visit
Admission: EM | Admit: 2023-05-04 | Discharge: 2023-05-04 | Disposition: A | Discharge status: Home / Self Care | Payer: BC Managed Care – PPO

## 2023-05-04 DIAGNOSIS — A084 Viral intestinal infection, unspecified: Secondary | ICD-10-CM

## 2023-05-04 MED ORDER — ONDANSETRON 4 MG PO TBDP: 4.0000 mg | ORAL_TABLET | Freq: Three times a day (TID) | ORAL | 0 refills | Status: DC | PRN

## 2023-05-04 NOTE — Discharge Instructions (Addendum)
Take the antinausea medication as directed.    Keep yourself hydrated with clear liquids, such as water and Gatorade.  Follow the diarrhea diet as tolerated.   Go to the emergency department if you have worsening symptoms.    Follow up with your primary care provider.      

## 2023-05-04 NOTE — ED Provider Notes (Signed)
Renaldo Fiddler    CSN: [id number] Arrival date & time: 05/04/23  1516      History   Chief Complaint Chief Complaint  Patient presents with   Nausea   Diarrhea    HPI Tamara Bridges is a 47 y.o. female.  Patient presents with 1 day history of fatigue and diarrhea.  She has nausea today.  She denies fever, chills, abdominal pain, vomiting, dysuria, hematuria, or other symptoms.  No recent travel out of the country.  No recent antibiotics.  She has been treating her symptoms with a bland diet and Pepto-Bismol.  Her medical history includes supraventricular tachycardia, current everyday smoker, pulmonary emphysema, obesity.  The history is provided by the patient and medical records.    Past Medical History:  Diagnosis Date   Tachycardia     There are no problems to display for this patient.   History reviewed. No pertinent surgical history.  OB History   No obstetric history on file.      Home Medications    Prior to Admission medications   Medication Sig Start Date End Date Taking? Authorizing Provider  citalopram (CELEXA) 40 MG tablet Take 40 mg by mouth daily. 04/30/23  Yes [provider]  doxycycline (VIBRA-TABS) 100 MG tablet Take 100 mg by mouth 2 (two) times daily. 03/17/23  Yes [provider]  ondansetron (ZOFRAN-ODT) 4 MG disintegrating tablet Take 1 tablet (4 mg total) by mouth every 8 (eight) hours as needed for nausea or vomiting. 05/04/23  Yes Mickie Bail, NP  albuterol (VENTOLIN HFA) 108 (90 Base) MCG/ACT inhaler Inhale 2 puffs into the lungs every 6 (six) hours as needed for wheezing or shortness of breath. Patient not taking: Reported on 05/04/2023 01/10/22   Willy Eddy, MD  Butalbital-APAP-Caffeine 50-300-40 MG CAPS  11/21/15   [provider]  cephALEXin (KEFLEX) 500 MG capsule Take 1 capsule (500 mg total) by mouth 2 (two) times daily. 09/17/16   Jene Every, MD  cyclobenzaprine (FLEXERIL) 10 MG tablet   11/21/15   [provider]  diclofenac (VOLTAREN) 75 MG EC tablet  11/20/15   [provider]  diclofenac (VOLTAREN) 75 MG EC tablet TAKE ONE TABLET BY MOUTH TWICE DAILY 07/26/16   Hyatt, Max T, DPM  diltiazem (CARDIZEM CD) 120 MG 24 hr capsule Take 120 mg by mouth daily.    [provider]  levocetirizine (XYZAL) 5 MG tablet  11/20/15   [provider]  metoprolol tartrate (LOPRESSOR) 25 MG tablet Take 0.5 tablets (12.5 mg total) by mouth 2 (two) times daily. Patient not taking: Reported on 05/04/2023 05/04/21 07/03/21  Sharman Cheek, MD  phentermine (ADIPEX-P) 37.5 MG tablet  11/20/15   [provider]    Family History History reviewed. No pertinent family history.  Social History Social History   Tobacco Use   Smoking status: Every Day    Current packs/day: 1.00    Types: Cigarettes  Substance Use Topics   Alcohol use: No   Drug use: No     Allergies   Amoxicillin, Azithromycin, Penicillins, and Prednisone   Review of Systems Review of Systems  Constitutional:  Negative for chills and fever.  Gastrointestinal:  Positive for diarrhea and nausea. Negative for abdominal pain and vomiting.  Genitourinary:  Negative for dysuria and hematuria.  All other systems reviewed and are negative.    Physical Exam Triage Vital Signs ED Triage Vitals  Encounter Vitals Group     BP 05/04/23 1535 121/84  Systolic BP Percentile --      Diastolic BP Percentile --      Pulse Rate 05/04/23 1525 (!) 105     Resp 05/04/23 1525 18     Temp 05/04/23 1525 97.7 F (36.5 C)     Temp src --      SpO2 05/04/23 1525 96 %     Weight --      Height --      Head Circumference --      Peak Flow --      Pain Score 05/04/23 1531 0     Pain Loc --      Pain Education --      Exclude from Growth Chart --    No data found.  Updated Vital Signs BP 121/84   Pulse (!) 105   Temp 97.7 F (36.5 C)   Resp 18   SpO2 96%   Visual Acuity Right Eye  Distance:   Left Eye Distance:   Bilateral Distance:    Right Eye Near:   Left Eye Near:    Bilateral Near:     Physical Exam Vitals and nursing note reviewed.  Constitutional:      General: She is not in acute distress.    Appearance: She is well-developed.  HENT:     Mouth/Throat:     Mouth: Mucous membranes are moist.  Cardiovascular:     Rate and Rhythm: Normal rate and regular rhythm.     Heart sounds: Normal heart sounds.  Pulmonary:     Effort: Pulmonary effort is normal. No respiratory distress.     Breath sounds: Normal breath sounds.  Abdominal:     General: Bowel sounds are normal.     Palpations: Abdomen is soft.     Tenderness: There is no abdominal tenderness. There is no right CVA tenderness, left CVA tenderness, guarding or rebound.  Musculoskeletal:     Cervical back: Neck supple.  Skin:    General: Skin is warm and dry.  Neurological:     Mental Status: She is alert.  Psychiatric:        Mood and Affect: Mood normal.        Behavior: Behavior normal.      UC Treatments / Results  Labs (all labs ordered are listed, but only abnormal results are displayed) Labs Reviewed - No data to display  EKG   Radiology No results found.  Procedures Procedures (including critical care time)  Medications Ordered in UC Medications - No data to display  Initial Impression / Assessment and Plan / UC Course  I have reviewed the triage vital signs and the nursing notes.  Pertinent labs & imaging results that were available during my care of the patient were reviewed by me and considered in my medical decision making (see chart for details).    Viral gastroenteritis.  No recent travel or antibiotic use.  No fever.  Abdomen is soft and nontender with good bowel sounds.  Treating nausea with Zofran.  Discussed clear liquid diet.  Instructed patient to advance to diarrhea diet as tolerated.  Discussed maintaining oral hydration at home; ED precautions discussed.   Education provided on viral gastroenteritis.  Instructed patient to follow up with her PCP if her symptoms are not improving.  She agrees to plan of care.   Final Clinical Impressions(s) / UC Diagnoses   Final diagnoses:  Viral gastroenteritis     Discharge Instructions      Take the antinausea  medication as directed.    Keep yourself hydrated with clear liquids, such as water and Gatorade.  Follow the diarrhea diet as tolerated.   Go to the emergency department if you have worsening symptoms.    Follow up with your primary care provider.          ED Prescriptions     Medication Sig Dispense Auth. Provider   ondansetron (ZOFRAN-ODT) 4 MG disintegrating tablet Take 1 tablet (4 mg total) by mouth every 8 (eight) hours as needed for nausea or vomiting. 20 tablet Mickie Bail, NP      PDMP not reviewed this encounter.   Mickie Bail, NP 05/04/23 [phone]

## 2023-05-04 NOTE — ED Triage Notes (Signed)
Patient to Urgent Care with complaints of diarrhea/ fatigue. Denies any known fevers.   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  Symptoms started yesterday. Decreased diarrhea today but having nausea now.

## 2023-07-10 ENCOUNTER — Other Ambulatory Visit: Payer: Self-pay

## 2023-07-10 MED ORDER — ECONAZOLE NITRATE 1 % EX CREA: TOPICAL_CREAM | Freq: Two times a day (BID) | CUTANEOUS | 2 refills | Status: DC

## 2023-07-10 MED ORDER — CLINDAMYCIN PHOSPHATE 1 % EX SOLN: Freq: Two times a day (BID) | CUTANEOUS | 2 refills | Status: DC | PRN

## 2023-07-10 MED ORDER — DOXYCYCLINE HYCLATE 100 MG PO CAPS
100.0000 mg | ORAL_CAPSULE | Freq: Two times a day (BID) | ORAL | 1 refills | Status: DC
  Filled 2023-07-18: qty 28, 14d supply, fill #0
  Filled 2024-04-20: qty 28, 14d supply, fill #1

## 2023-07-10 MED ORDER — DICLOFENAC SODIUM 75 MG PO TBEC
75.0000 mg | DELAYED_RELEASE_TABLET | Freq: Two times a day (BID) | ORAL | 2 refills | Status: DC | PRN
  Filled 2023-08-01: qty 172, 86d supply, fill #0
  Filled 2023-11-04: qty 172, 86d supply, fill #1
  Filled 2024-01-28: qty 90, 45d supply, fill #2
  Filled ????-??-??: fill #1

## 2023-07-10 MED ORDER — DOXYCYCLINE HYCLATE 100 MG PO TBEC: 200.0000 mg | DELAYED_RELEASE_TABLET | Freq: Two times a day (BID) | ORAL | 0 refills | Status: DC

## 2023-07-10 MED ORDER — DILTIAZEM HCL ER 120 MG PO CP24
120.0000 mg | ORAL_CAPSULE | Freq: Every day | ORAL | 2 refills | Status: AC
  Filled 2023-08-01: qty 90, 90d supply, fill #0
  Filled 2023-11-04: qty 90, 90d supply, fill #1
  Filled 2024-01-28: qty 60, 60d supply, fill #2
  Filled ????-??-??: fill #1

## 2023-07-18 ENCOUNTER — Other Ambulatory Visit: Payer: Self-pay

## 2023-08-04 ENCOUNTER — Other Ambulatory Visit: Payer: Self-pay

## 2023-08-29 DIAGNOSIS — R7309 Other abnormal glucose: Secondary | ICD-10-CM | POA: Diagnosis not present

## 2023-08-29 DIAGNOSIS — E785 Hyperlipidemia, unspecified: Secondary | ICD-10-CM | POA: Diagnosis not present

## 2023-09-02 ENCOUNTER — Other Ambulatory Visit: Payer: Self-pay | Admitting: Family Medicine

## 2023-09-02 DIAGNOSIS — Z1231 Encounter for screening mammogram for malignant neoplasm of breast: Secondary | ICD-10-CM

## 2023-09-12 DIAGNOSIS — Z1211 Encounter for screening for malignant neoplasm of colon: Secondary | ICD-10-CM | POA: Diagnosis not present

## 2023-09-23 DIAGNOSIS — Z1231 Encounter for screening mammogram for malignant neoplasm of breast: Secondary | ICD-10-CM | POA: Diagnosis not present

## 2023-09-28 DIAGNOSIS — M1711 Unilateral primary osteoarthritis, right knee: Secondary | ICD-10-CM | POA: Diagnosis not present

## 2023-09-29 DIAGNOSIS — I471 Supraventricular tachycardia, unspecified: Secondary | ICD-10-CM | POA: Diagnosis not present

## 2023-09-29 DIAGNOSIS — Z8249 Family history of ischemic heart disease and other diseases of the circulatory system: Secondary | ICD-10-CM | POA: Diagnosis not present

## 2023-09-29 DIAGNOSIS — R002 Palpitations: Secondary | ICD-10-CM | POA: Diagnosis not present

## 2023-09-29 DIAGNOSIS — I2510 Atherosclerotic heart disease of native coronary artery without angina pectoris: Secondary | ICD-10-CM | POA: Diagnosis not present

## 2023-09-29 DIAGNOSIS — F17210 Nicotine dependence, cigarettes, uncomplicated: Secondary | ICD-10-CM | POA: Diagnosis not present

## 2023-09-29 DIAGNOSIS — J439 Emphysema, unspecified: Secondary | ICD-10-CM | POA: Diagnosis not present

## 2023-10-01 ENCOUNTER — Other Ambulatory Visit: Payer: Self-pay

## 2023-10-01 MED ORDER — LEVOCETIRIZINE DIHYDROCHLORIDE 5 MG PO TABS
5.0000 mg | ORAL_TABLET | Freq: Every day | ORAL | 4 refills | Status: AC
  Filled 2023-10-01 – 2024-02-23 (×2): qty 90, 90d supply, fill #0
  Filled 2024-05-21: qty 90, 90d supply, fill #1
  Filled 2024-08-02 – 2024-08-04 (×2): qty 90, 90d supply, fill #2
  Filled 2024-09-24 (×2): qty 90, 90d supply, fill #3

## 2023-10-13 ENCOUNTER — Other Ambulatory Visit: Payer: Self-pay

## 2023-10-14 ENCOUNTER — Other Ambulatory Visit: Payer: Self-pay

## 2023-10-28 ENCOUNTER — Other Ambulatory Visit: Payer: Self-pay

## 2023-10-30 ENCOUNTER — Other Ambulatory Visit: Payer: Self-pay

## 2023-10-31 DIAGNOSIS — R928 Other abnormal and inconclusive findings on diagnostic imaging of breast: Secondary | ICD-10-CM | POA: Diagnosis not present

## 2023-11-04 ENCOUNTER — Other Ambulatory Visit: Payer: Self-pay

## 2023-11-05 ENCOUNTER — Other Ambulatory Visit: Payer: Self-pay

## 2023-11-06 ENCOUNTER — Other Ambulatory Visit: Payer: Self-pay

## 2024-01-07 ENCOUNTER — Emergency Department: Payer: Self-pay

## 2024-01-07 ENCOUNTER — Other Ambulatory Visit: Payer: Self-pay

## 2024-01-07 ENCOUNTER — Emergency Department
Admission: EM | Admit: 2024-01-07 | Discharge: 2024-01-07 | Disposition: A | Discharge status: Home / Self Care | Payer: Self-pay | Attending: Emergency Medicine | Admitting: Emergency Medicine

## 2024-01-07 ENCOUNTER — Encounter: Payer: Self-pay | Admitting: Emergency Medicine

## 2024-01-07 DIAGNOSIS — R600 Localized edema: Secondary | ICD-10-CM | POA: Diagnosis not present

## 2024-01-07 DIAGNOSIS — M25461 Effusion, right knee: Secondary | ICD-10-CM | POA: Insufficient documentation

## 2024-01-07 DIAGNOSIS — R2243 Localized swelling, mass and lump, lower limb, bilateral: Secondary | ICD-10-CM | POA: Diagnosis present

## 2024-01-07 LAB — CBC WITH DIFFERENTIAL/PLATELET
Abs Immature Granulocytes: 0.04 10*3/uL (ref 0.00–0.07)
Basophils Absolute: 0.1 10*3/uL (ref 0.0–0.1)
Basophils Relative: 1 %
Eosinophils Absolute: 0.3 10*3/uL (ref 0.0–0.5)
Eosinophils Relative: 3 %
HCT: 38.3 % (ref 36.0–46.0)
Hemoglobin: 12.4 g/dL (ref 12.0–15.0)
Immature Granulocytes: 0 %
Lymphocytes Relative: 20 %
Lymphs Abs: 2.1 10*3/uL (ref 0.7–4.0)
MCH: 28 pg (ref 26.0–34.0)
MCHC: 32.4 g/dL (ref 30.0–36.0)
MCV: 86.5 fL (ref 80.0–100.0)
Monocytes Absolute: 0.6 10*3/uL (ref 0.1–1.0)
Monocytes Relative: 5 %
Neutro Abs: 7.4 10*3/uL (ref 1.7–7.7)
Neutrophils Relative %: 71 %
Platelets: 339 10*3/uL (ref 150–400)
RBC: 4.43 MIL/uL (ref 3.87–5.11)
RDW: 13.9 % (ref 11.5–15.5)
WBC: 10.5 10*3/uL (ref 4.0–10.5)
nRBC: 0 % (ref 0.0–0.2)

## 2024-01-07 LAB — COMPREHENSIVE METABOLIC PANEL WITH GFR
ALT: 16 U/L (ref 0–44)
AST: 22 U/L (ref 15–41)
Albumin: 2.9 g/dL — ABNORMAL LOW (ref 3.5–5.0)
Alkaline Phosphatase: 73 U/L (ref 38–126)
Anion gap: 7 (ref 5–15)
BUN: 11 mg/dL (ref 6–20)
CO2: 25 mmol/L (ref 22–32)
Calcium: 8.3 mg/dL — ABNORMAL LOW (ref 8.9–10.3)
Chloride: 105 mmol/L (ref 98–111)
Creatinine, Ser: 0.7 mg/dL (ref 0.44–1.00)
GFR, Estimated: >60 mL/min (ref >60)
Glucose, Bld: 129 mg/dL — ABNORMAL HIGH (ref 70–99)
Potassium: 4 mmol/L (ref 3.5–5.1)
Sodium: 137 mmol/L (ref 135–145)
Total Bilirubin: 0.3 mg/dL (ref 0.0–1.2)
Total Protein: 6 g/dL — ABNORMAL LOW (ref 6.5–8.1)

## 2024-01-07 LAB — BRAIN NATRIURETIC PEPTIDE: B Natriuretic Peptide: 22.6 pg/mL (ref 0.0–100.0)

## 2024-01-07 MED ORDER — FUROSEMIDE 20 MG PO TABS
20.0000 mg | ORAL_TABLET | Freq: Every day | ORAL | 0 refills | Status: DC
  Filled 2024-01-07: qty 7, 7d supply, fill #0

## 2024-01-07 NOTE — ED Notes (Signed)
 Patient complaining of pain around IV site; educated that will remove but if there is a need for IV access, will have to restart. Patient agreeable with this plan.

## 2024-01-07 NOTE — Discharge Instructions (Signed)
 Keep your legs elevated when at home and not driving.  You may take the Lasix once daily for the next week.  Return to the ER immediately for new, worsening, or persistent severe swelling, pain, redness, or any other new or worsening symptoms that concern you.  Follow-up with your primary care provider in the next several weeks.

## 2024-01-07 NOTE — ED Provider Notes (Signed)
 [facility] Provider Note    Event Date/Time   First MD Initiated Contact with Patient 01/07/24 [phone]     (approximate)   History   Leg Swelling   HPI  Tamara Bridges is a 48 y.o. female with a history of SVT and depression who presents with bilateral leg swelling, acute onset yesterday, persistent today, somewhat worse on the right and associated with numbness in the right knee as well as some swelling and fluid in the knee.  The patient denies any injuries.  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  She denies any chest pain or difficulty breathing.  She denies any prior history of leg swelling like this.  I reviewed the past medical records.  The patient's most recent outpatient encounter was with cardiology at [facility] on 2/30 for follow-up of her SVT.   Physical Exam   Triage Vital Signs: ED Triage Vitals  Encounter Vitals Group     BP 01/07/24 0939 114/60     Systolic BP Percentile --      Diastolic BP Percentile --      Pulse Rate 01/07/24 0939 85     Resp 01/07/24 0939 17     Temp 01/07/24 0939 98.4 F (36.9 C)     Temp Source 01/07/24 0939 Oral     SpO2 01/07/24 0939 94 %     Weight 01/07/24 0938 290 lb (131.5 kg)     Height 01/07/24 0938 5\' 6"  (1.676 m)     Head Circumference --      Peak Flow --      Pain Score 01/07/24 0938 7     Pain Loc --      Pain Education --      Exclude from Growth Chart --     Most recent vital signs: Vitals:   01/07/24 1130 01/07/24 1200  BP: 109/62 (!) 114/53  Pulse: 73 68  Resp:  18  Temp:    SpO2: 95% 97%     General: Awake, no distress.  CV:  Good peripheral perfusion.  Resp:  Normal effort.  Abd:  No distention.  Other:  1+ bilateral lower extremity pitting edema, slightly worse on the right.  Mild effusion to the right knee.  No deformity.  Full range of motion at the knee.  2+ DP pulses bilaterally.  Normal cap  refill.  Normal color.  No erythema, induration, or abnormal warmth.   ED Results / Procedures / Treatments   Labs (all labs ordered are listed, but only abnormal results are displayed) Labs Reviewed  COMPREHENSIVE METABOLIC PANEL WITH GFR - Abnormal; Notable for the following components:      Result Value   Glucose, Bld 129 (*)    Calcium 8.3 (*)    Total Protein 6.0 (*)    Albumin 2.9 (*)    All other components within normal limits  CBC WITH DIFFERENTIAL/PLATELET  BRAIN NATRIURETIC PEPTIDE     EKG     RADIOLOGY  US  venous LE bilateral:   IMPRESSION:  No evidence of bilateral lower extremity DVT.     PROCEDURES:  Critical Care performed: No  Procedures   MEDICATIONS ORDERED IN ED: Medications - No data to display   IMPRESSION / MDM / ASSESSMENT AND PLAN / ED COURSE  I reviewed the triage vital signs and the nursing notes.  48 year old female with PMH as noted  above presents with acute onset of bilateral lower extremity edema, somewhat worse on the right with no significant associated symptoms and no preceding trauma.  On exam she does have pitting edema bilaterally that is somewhat worse on the right, but no erythema, induration, or other cutaneous changes and normal circulation.  Differential diagnosis includes, but is not limited to, dependent edema, venous stasis, lymphedema, DVT, fluid overload due to other etiologies such as CHF or hepatic dysfunction.  We will obtain basic labs and LFTs, BNP, and bilateral DVT studies.  Patient's presentation is most consistent with acute complicated illness / injury requiring diagnostic workup.  ----------------------------------------- 12:51 PM on 01/07/2024 -----------------------------------------  CMP and CBC show no acute findings.  Renal function is normal.  LFTs are normal.  BNP is negative.  Ultrasound studies are negative.  Overall presentation is consistent with dependent edema.  I have prescribed a short  course of Lasix for symptom control and have encouraged the patient to elevate her legs.  She is stable for discharge home at this time.  Return precautions given, and she expresses understanding.   FINAL CLINICAL IMPRESSION(S) / ED DIAGNOSES   Final diagnoses:  Peripheral edema     Rx / DC Orders   ED Discharge Orders          Ordered    furosemide (LASIX) 20 MG tablet  Daily        01/07/24 1251             Note:  This document was prepared using Dragon voice recognition software and may include unintentional dictation errors.    Lind Repine, MD 01/07/24 1544

## 2024-01-07 NOTE — ED Triage Notes (Signed)
 Patient to ED via POV for bilateral leg swelling. Pt reports waking up like this. States the right knee sensation feels different from the left that she first noticed this AM.

## 2024-01-28 ENCOUNTER — Other Ambulatory Visit: Payer: Self-pay

## 2024-01-28 MED ORDER — CITALOPRAM HYDROBROMIDE 40 MG PO TABS
40.0000 mg | ORAL_TABLET | Freq: Every day | ORAL | 3 refills | Status: AC
Start: 2023-07-29 — End: ?
  Filled 2024-01-28: qty 90, 90d supply, fill #0
  Filled 2024-05-18: qty 90, 90d supply, fill #1

## 2024-02-05 ENCOUNTER — Other Ambulatory Visit: Payer: Self-pay

## 2024-02-23 ENCOUNTER — Other Ambulatory Visit: Payer: Self-pay

## 2024-02-23 DIAGNOSIS — Z1389 Encounter for screening for other disorder: Secondary | ICD-10-CM | POA: Diagnosis not present

## 2024-02-23 DIAGNOSIS — Z0000 Encounter for general adult medical examination without abnormal findings: Secondary | ICD-10-CM | POA: Diagnosis not present

## 2024-02-23 DIAGNOSIS — Z124 Encounter for screening for malignant neoplasm of cervix: Secondary | ICD-10-CM | POA: Diagnosis not present

## 2024-02-23 DIAGNOSIS — R601 Generalized edema: Secondary | ICD-10-CM | POA: Diagnosis not present

## 2024-02-23 DIAGNOSIS — E785 Hyperlipidemia, unspecified: Secondary | ICD-10-CM | POA: Diagnosis not present

## 2024-02-23 DIAGNOSIS — E119 Type 2 diabetes mellitus without complications: Secondary | ICD-10-CM | POA: Diagnosis not present

## 2024-02-23 DIAGNOSIS — R7309 Other abnormal glucose: Secondary | ICD-10-CM | POA: Diagnosis not present

## 2024-02-23 MED ORDER — SOLIFENACIN SUCCINATE 10 MG PO TABS
10.0000 mg | ORAL_TABLET | Freq: Every day | ORAL | 6 refills | Status: DC
  Filled 2024-02-23: qty 90, 90d supply, fill #0
  Filled 2024-05-21: qty 90, 90d supply, fill #1
  Filled 2024-08-12: qty 30, 30d supply, fill #2

## 2024-02-23 MED ORDER — FUROSEMIDE 20 MG PO TABS
20.0000 mg | ORAL_TABLET | Freq: Every day | ORAL | 1 refills | Status: AC | PRN
  Filled 2024-02-23: qty 90, 90d supply, fill #0

## 2024-02-24 ENCOUNTER — Other Ambulatory Visit: Payer: Self-pay

## 2024-03-02 ENCOUNTER — Other Ambulatory Visit: Payer: Self-pay

## 2024-03-02 MED ORDER — ATORVASTATIN CALCIUM 20 MG PO TABS
20.0000 mg | ORAL_TABLET | Freq: Every day | ORAL | 5 refills | Status: DC
  Filled 2024-03-02: qty 30, 30d supply, fill #0

## 2024-03-12 ENCOUNTER — Other Ambulatory Visit: Payer: Self-pay

## 2024-03-12 MED ORDER — EZETIMIBE 10 MG PO TABS
10.0000 mg | ORAL_TABLET | Freq: Every day | ORAL | 5 refills | Status: DC
  Filled 2024-03-12: qty 30, 30d supply, fill #0
  Filled 2024-04-20: qty 30, 30d supply, fill #1
  Filled 2024-05-18: qty 30, 30d supply, fill #2
  Filled 2024-06-12: qty 30, 30d supply, fill #3
  Filled 2024-08-02: qty 30, 30d supply, fill #4
  Filled 2024-09-10: qty 30, 30d supply, fill #5

## 2024-03-17 ENCOUNTER — Other Ambulatory Visit: Payer: Self-pay

## 2024-03-17 MED ORDER — DICLOFENAC SODIUM 75 MG PO TBEC
75.0000 mg | DELAYED_RELEASE_TABLET | Freq: Two times a day (BID) | ORAL | 2 refills | Status: DC | PRN
  Filled 2024-03-17: qty 172, 86d supply, fill #0
  Filled 2024-06-12: qty 172, 86d supply, fill #1
  Filled 2024-08-12 – 2024-09-10 (×2): qty 172, 86d supply, fill #2

## 2024-03-25 DIAGNOSIS — R002 Palpitations: Secondary | ICD-10-CM | POA: Diagnosis not present

## 2024-03-25 DIAGNOSIS — E66813 Obesity, class 3: Secondary | ICD-10-CM | POA: Diagnosis not present

## 2024-03-25 DIAGNOSIS — I471 Supraventricular tachycardia, unspecified: Secondary | ICD-10-CM | POA: Diagnosis not present

## 2024-03-25 DIAGNOSIS — I2510 Atherosclerotic heart disease of native coronary artery without angina pectoris: Secondary | ICD-10-CM | POA: Diagnosis not present

## 2024-03-25 DIAGNOSIS — Z8249 Family history of ischemic heart disease and other diseases of the circulatory system: Secondary | ICD-10-CM | POA: Diagnosis not present

## 2024-03-25 DIAGNOSIS — J439 Emphysema, unspecified: Secondary | ICD-10-CM | POA: Diagnosis not present

## 2024-03-25 DIAGNOSIS — Z720 Tobacco use: Secondary | ICD-10-CM | POA: Diagnosis not present

## 2024-03-26 ENCOUNTER — Other Ambulatory Visit: Payer: Self-pay

## 2024-03-26 MED ORDER — DILTIAZEM HCL ER COATED BEADS 120 MG PO CP24
120.0000 mg | ORAL_CAPSULE | Freq: Every day | ORAL | 3 refills | Status: AC
  Filled 2024-03-26: qty 90, 90d supply, fill #0
  Filled 2024-05-18 – 2024-06-12 (×2): qty 90, 90d supply, fill #1
  Filled 2024-08-02 – 2024-09-10 (×3): qty 90, 90d supply, fill #2
  Filled 2024-09-24 (×2): qty 90, 90d supply, fill #3

## 2024-04-20 ENCOUNTER — Other Ambulatory Visit: Payer: Self-pay

## 2024-04-21 ENCOUNTER — Other Ambulatory Visit: Payer: Self-pay

## 2024-04-21 MED ORDER — DILTIAZEM HCL ER COATED BEADS 120 MG PO CP24: 120.0000 mg | ORAL_CAPSULE | Freq: Every day | ORAL | 3 refills | Status: AC

## 2024-04-23 ENCOUNTER — Other Ambulatory Visit: Payer: Self-pay

## 2024-05-18 ENCOUNTER — Other Ambulatory Visit: Payer: Self-pay

## 2024-05-21 ENCOUNTER — Other Ambulatory Visit: Payer: Self-pay

## 2024-06-03 ENCOUNTER — Other Ambulatory Visit: Payer: Self-pay

## 2024-06-04 ENCOUNTER — Emergency Department
Admission: EM | Admit: 2024-06-04 | Discharge: 2024-06-04 | Disposition: A | Discharge status: Home / Self Care | Attending: Emergency Medicine | Admitting: Emergency Medicine

## 2024-06-04 ENCOUNTER — Other Ambulatory Visit: Payer: Self-pay

## 2024-06-04 ENCOUNTER — Emergency Department

## 2024-06-04 DIAGNOSIS — Z043 Encounter for examination and observation following other accident: Secondary | ICD-10-CM | POA: Diagnosis not present

## 2024-06-04 DIAGNOSIS — S8992XA Unspecified injury of left lower leg, initial encounter: Secondary | ICD-10-CM | POA: Diagnosis not present

## 2024-06-04 DIAGNOSIS — S8002XA Contusion of left knee, initial encounter: Secondary | ICD-10-CM | POA: Diagnosis not present

## 2024-06-04 DIAGNOSIS — W19XXXA Unspecified fall, initial encounter: Secondary | ICD-10-CM | POA: Diagnosis not present

## 2024-06-04 DIAGNOSIS — W1830XA Fall on same level, unspecified, initial encounter: Secondary | ICD-10-CM | POA: Insufficient documentation

## 2024-06-04 NOTE — ED Triage Notes (Signed)
 Pt arrive POV, ambulatory to triage w/ limping gait c/o of falling. Pt states her shoe got caught on mat causing her to fall. Pt reporting left knee pain w/ obvious swelling. Pt reports hitting face on a cup but denies LOC. NADN.

## 2024-06-04 NOTE — Discharge Instructions (Addendum)
 Please take Tylenol  and ibuprofen/Advil for your pain.  It is safe to take them together, or to alternate them every few hours.  Take up to 1000mg  of Tylenol  at a time, up to 4 times per day.  Do not take more than 4000 mg of Tylenol  in 24 hours.  For ibuprofen, take 400-600 mg, 3 - 4 times per day.  Ice, elevation, compression

## 2024-06-04 NOTE — ED Provider Notes (Signed)
 [facility] Provider Note    Event Date/Time   First MD Initiated Contact with Patient 06/04/24 [phone]     (approximate)   History   Fall   HPI  Tamara Bridges is a 48 y.o. female who presents to the ED for evaluation of Fall   Morbidly obese patient presents to the ED with left knee injury.  Mechanical fall causing her to fall forward and strike the anterior portion of her left knee over her patella on the ground directly.  Ambulatory since then but painful   Physical Exam   Triage Vital Signs: ED Triage Vitals  Encounter Vitals Group     BP 06/04/24 0011 121/79     Girls Systolic BP Percentile --      Girls Diastolic BP Percentile --      Boys Systolic BP Percentile --      Boys Diastolic BP Percentile --      Pulse Rate 06/04/24 0011 87     Resp 06/04/24 0011 20     Temp 06/04/24 0011 97.6 F (36.4 C)     Temp Source 06/04/24 0011 Oral     SpO2 06/04/24 0011 98 %     Weight 06/04/24 0011 280 lb (127 kg)     Height 06/04/24 0011 5' 6 (1.676 m)     Head Circumference --      Peak Flow --      Pain Score 06/04/24 0023 10     Pain Loc --      Pain Education --      Exclude from Growth Chart --     Most recent vital signs: Vitals:   06/04/24 0011 06/04/24 0023  BP: 121/79   Pulse: 87   Resp: 20   Temp: 97.6 F (36.4 C)   SpO2: 98% 98%    General: Awake, no distress.  CV:  Good peripheral perfusion.  Resp:  Normal effort.  Abd:  No distention.  MSK:   Anterior bruising over the patella of the left knee with mild localized tenderness.  Full active range of motion.  No laceration or evidence of open injury  Neuro:  No focal deficits appreciated. Other:     ED Results / Procedures / Treatments   Labs (all labs ordered are listed, but only abnormal results are displayed) Labs Reviewed - No data to display  EKG   RADIOLOGY Plain film of the left knee interpreted by me without evidence of fracture or  dislocation  Official radiology report(s): DG Knee Complete 4 Views Left Result Date: 06/04/2024 CLINICAL DATA:  fall EXAM: LEFT KNEE - COMPLETE 4+ VIEW COMPARISON:  None Available. FINDINGS: No evidence of fracture, dislocation, or joint effusion. No evidence of arthropathy or other focal bone abnormality. Soft tissues are unremarkable. IMPRESSION: Negative. Electronically Signed   By: Morgane  Naveau M.D.   On: 06/04/2024 01:04    PROCEDURES and INTERVENTIONS:  Procedures  Medications - No data to display   IMPRESSION / MDM / ASSESSMENT AND PLAN / ED COURSE  I reviewed the triage vital signs and the nursing notes.  Differential diagnosis includes, but is not limited to, patellar fracture, soft tissue injury, dislocation  Patient presents with left knee pain after a fall.  Isolated injury without signs of additional trauma.  Reassuring x-rays and exam.  Discussed conservative measures, expectant management      FINAL CLINICAL IMPRESSION(S) / ED DIAGNOSES   Final diagnoses:  Fall, initial encounter  Injury  of left knee, initial encounter     Rx / DC Orders   ED Discharge Orders     None        Note:  This document was prepared using Dragon voice recognition software and may include unintentional dictation errors.   Claudene Rover, MD 06/04/24 [phone]

## 2024-08-03 ENCOUNTER — Other Ambulatory Visit: Payer: Self-pay

## 2024-08-04 ENCOUNTER — Other Ambulatory Visit: Payer: Self-pay

## 2024-08-12 ENCOUNTER — Other Ambulatory Visit: Payer: Self-pay

## 2024-08-12 MED ORDER — CITALOPRAM HYDROBROMIDE 40 MG PO TABS
40.0000 mg | ORAL_TABLET | Freq: Every day | ORAL | 3 refills | Status: AC
  Filled 2024-08-12: qty 90, 90d supply, fill #0
  Filled 2024-09-24: qty 90, 90d supply, fill #1

## 2024-08-29 ENCOUNTER — Telehealth: Admitting: Physician Assistant

## 2024-08-29 DIAGNOSIS — J019 Acute sinusitis, unspecified: Secondary | ICD-10-CM | POA: Diagnosis not present

## 2024-08-29 DIAGNOSIS — B9789 Other viral agents as the cause of diseases classified elsewhere: Secondary | ICD-10-CM

## 2024-08-30 ENCOUNTER — Other Ambulatory Visit: Payer: Self-pay

## 2024-08-30 MED ORDER — PROMETHAZINE-DM 6.25-15 MG/5ML PO SYRP
5.0000 mL | ORAL_SOLUTION | Freq: Four times a day (QID) | ORAL | 0 refills | Status: DC | PRN
  Filled 2024-08-30: qty 118, 6d supply, fill #0

## 2024-08-30 MED ORDER — FLUTICASONE PROPIONATE 50 MCG/ACT NA SUSP
2.0000 | Freq: Every day | NASAL | 0 refills | Status: DC
  Filled 2024-08-30: qty 16, 30d supply, fill #0

## 2024-08-30 NOTE — Progress Notes (Signed)
 We are sorry that you are not feeling well.  Here is how we plan to help!  Based on what you have shared with me it looks like you have sinusitis.  Sinusitis is inflammation and infection in the sinus cavities of the head.  Based on your presentation I believe you most likely have Acute Viral Sinusitis.This is an infection most likely caused by a virus. There is not specific treatment for viral sinusitis other than to help you with the symptoms until the infection runs its course.  You may use an oral decongestant such as Mucinex D or if you have glaucoma or high blood pressure use plain Mucinex. Saline nasal spray help and can safely be used as often as needed for congestion, I have prescribed: Fluticasone  nasal spray two sprays in each nostril once a day. I have also prescribed Promethazine  DM cough syrup Take 5mL every 6 hours as needed for cough and congestion.   Some authorities believe that zinc sprays or the use of Echinacea may shorten the course of your symptoms.  Sinus infections are not as easily transmitted as other respiratory infection, however we still recommend that you avoid close contact with loved ones, especially the very young and elderly.  Remember to wash your hands thoroughly throughout the day as this is the number one way to prevent the spread of infection!  Home Care: Only take medications as instructed by your medical team. Do not take these medications with alcohol. A steam or ultrasonic humidifier can help congestion.  You can place a towel over your head and breathe in the steam from hot water coming from a faucet. Avoid close contacts especially the very young and the elderly. Cover your mouth when you cough or sneeze. Always remember to wash your hands.  Get Help Right Away If: You develop worsening fever or sinus pain. You develop a severe head ache or visual changes. Your symptoms persist after you have completed your treatment plan.  Make sure you Understand  these instructions. Will watch your condition. Will get help right away if you are not doing well or get worse.  Your e-visit answers were reviewed by a board certified advanced clinical practitioner to complete your personal care plan.  Depending on the condition, your plan could have included both over the counter or prescription medications.  If there is a problem please reply  once you have received a response from your provider.  Your safety is important to us .  If you have drug allergies check your prescription carefully.    You can use MyChart to ask questions about todays visit, request a non-urgent call back, or ask for a work or school excuse for 24 hours related to this e-Visit. If it has been greater than 24 hours you will need to follow up with your provider, or enter a new e-Visit to address those concerns.  You will get an e-mail in the next two days asking about your experience.  I hope that your e-visit has been valuable and will speed your recovery. Thank you for using e-visits.  I have spent 5 minutes in review of e-visit questionnaire, review and updating patient chart, medical decision making and response to patient.   Delon CHRISTELLA Dickinson, PA-C

## 2024-09-02 ENCOUNTER — Other Ambulatory Visit: Payer: Self-pay

## 2024-09-02 ENCOUNTER — Telehealth: Admitting: Family Medicine

## 2024-09-02 DIAGNOSIS — B9689 Other specified bacterial agents as the cause of diseases classified elsewhere: Secondary | ICD-10-CM

## 2024-09-02 MED ORDER — DOXYCYCLINE HYCLATE 100 MG PO TABS
100.0000 mg | ORAL_TABLET | Freq: Two times a day (BID) | ORAL | 0 refills | Status: AC
  Filled 2024-09-02: qty 14, 7d supply, fill #0

## 2024-09-02 NOTE — Progress Notes (Signed)
 E-Visit for Sinus Problems  We are sorry that you are not feeling well.  Here is how we plan to help!  Based on what you have shared with me it looks like you have sinusitis.  Sinusitis is inflammation and infection in the sinus cavities of the head.  Based on your presentation I believe you most likely have Acute Bacterial Sinusitis.  This is an infection caused by bacteria and is treated with antibiotics. I have prescribed Doxycycline 100mg  by mouth twice a day for 7 days. You may use an oral decongestant such as Mucinex D or if you have glaucoma or high blood pressure use plain Mucinex. Saline nasal spray help and can safely be used as often as needed for congestion.  If you develop worsening sinus pain, fever or notice severe headache and vision changes, or if symptoms are not better after completion of antibiotic, please schedule an appointment with a health care provider.    Sinus infections are not as easily transmitted as other respiratory infection, however we still recommend that you avoid close contact with loved ones, especially the very young and elderly.  Remember to wash your hands thoroughly throughout the day as this is the number one way to prevent the spread of infection!  Home Care: Only take medications as instructed by your medical team. Complete the entire course of an antibiotic. Do not take these medications with alcohol. A steam or ultrasonic humidifier can help congestion.  You can place a towel over your head and breathe in the steam from hot water coming from a faucet. Avoid close contacts especially the very young and the elderly. Cover your mouth when you cough or sneeze. Always remember to wash your hands.  Get Help Right Away If: You develop worsening fever or sinus pain. You develop a severe head ache or visual changes. Your symptoms persist after you have completed your treatment plan.  Make sure you Understand these instructions. Will watch your  condition. Will get help right away if you are not doing well or get worse.  Your e-visit answers were reviewed by a board certified advanced clinical practitioner to complete your personal care plan.  Depending on the condition, your plan could have included both over the counter or prescription medications.  If there is a problem please reply  once you have received a response from your provider.  Your safety is important to us .  If you have drug allergies check your prescription carefully.    You can use MyChart to ask questions about today's visit, request a non-urgent call back, or ask for a work or school excuse for 24 hours related to this e-Visit. If it has been greater than 24 hours you will need to follow up with your provider, or enter a new e-Visit to address those concerns.  You will get an e-mail in the next two days asking about your experience.  I hope that your e-visit has been valuable and will speed your recovery. Thank you for using e-visits.  I have spent 5 minutes in review of e-visit questionnaire, review and updating patient chart, medical decision making and response to patient.   Chiquita CHRISTELLA Barefoot, NP

## 2024-09-10 ENCOUNTER — Other Ambulatory Visit: Payer: Self-pay

## 2024-09-19 ENCOUNTER — Other Ambulatory Visit (HOSPITAL_COMMUNITY): Payer: Self-pay

## 2024-09-20 ENCOUNTER — Other Ambulatory Visit (HOSPITAL_COMMUNITY): Payer: Self-pay

## 2024-09-20 ENCOUNTER — Other Ambulatory Visit: Payer: Self-pay

## 2024-09-20 MED ORDER — SOLIFENACIN SUCCINATE 10 MG PO TABS
10.0000 mg | ORAL_TABLET | Freq: Every day | ORAL | 5 refills | Status: AC
  Filled 2024-09-20: qty 90, 90d supply, fill #0
  Filled 2024-09-24 (×2): qty 30, 30d supply, fill #0

## 2024-09-20 MED ORDER — EZETIMIBE 10 MG PO TABS
10.0000 mg | ORAL_TABLET | Freq: Every day | ORAL | 5 refills | Status: AC
  Filled 2024-09-24 (×2): qty 30, 30d supply, fill #0

## 2024-09-21 ENCOUNTER — Encounter: Payer: Self-pay | Admitting: Pharmacist

## 2024-09-21 ENCOUNTER — Other Ambulatory Visit: Payer: Self-pay

## 2024-09-24 ENCOUNTER — Other Ambulatory Visit: Payer: Self-pay

## 2024-09-25 ENCOUNTER — Other Ambulatory Visit: Payer: Self-pay

## 2024-09-27 ENCOUNTER — Other Ambulatory Visit: Payer: Self-pay

## 2024-09-28 ENCOUNTER — Telehealth: Admitting: Physician Assistant

## 2024-09-28 ENCOUNTER — Other Ambulatory Visit: Payer: Self-pay

## 2024-09-28 DIAGNOSIS — M722 Plantar fascial fibromatosis: Secondary | ICD-10-CM

## 2024-09-28 MED ORDER — DICLOFENAC SODIUM 75 MG PO TBEC
75.0000 mg | DELAYED_RELEASE_TABLET | Freq: Two times a day (BID) | ORAL | 2 refills | Status: AC | PRN
  Filled 2024-09-28: qty 172, 86d supply, fill #0

## 2024-09-28 MED ORDER — DICLOFENAC SODIUM 75 MG PO TBEC
75.0000 mg | DELAYED_RELEASE_TABLET | Freq: Two times a day (BID) | ORAL | 0 refills | Status: AC
  Filled 2024-09-28: qty 60, 30d supply, fill #0
